# Patient Record
Sex: Male | Born: 1950 | Race: White | Hispanic: No | Marital: Married | Smoking: Never smoker
Health system: Southern US, Community
[De-identification: ages and names within clinical notes are randomized; demographics above are authoritative.]

## PROBLEM LIST (undated history)

## (undated) DIAGNOSIS — H9190 Unspecified hearing loss, unspecified ear: Secondary | ICD-10-CM

## (undated) DIAGNOSIS — F419 Anxiety disorder, unspecified: Secondary | ICD-10-CM

## (undated) DIAGNOSIS — E119 Type 2 diabetes mellitus without complications: Secondary | ICD-10-CM

## (undated) DIAGNOSIS — G4730 Sleep apnea, unspecified: Secondary | ICD-10-CM

## (undated) HISTORY — PX: TIBIA FRACTURE SURGERY: SHX806

## (undated) HISTORY — DX: Anxiety disorder, unspecified: F41.9

## (undated) HISTORY — DX: Sleep apnea, unspecified: G47.30

## (undated) HISTORY — DX: Unspecified hearing loss, unspecified ear: H91.90

## (undated) HISTORY — PX: HERNIA REPAIR: SHX51

## (undated) HISTORY — DX: Type 2 diabetes mellitus without complications: E11.9

---

## 1999-10-12 ENCOUNTER — Encounter: Admission: RE | Admit: 1999-10-12 | Discharge: 1999-10-12 | Payer: Self-pay | Admitting: *Deleted

## 1999-10-12 ENCOUNTER — Encounter: Payer: Self-pay | Admitting: *Deleted

## 2000-12-11 ENCOUNTER — Ambulatory Visit (HOSPITAL_BASED_OUTPATIENT_CLINIC_OR_DEPARTMENT_OTHER): Admission: RE | Admit: 2000-12-11 | Discharge: 2000-12-11 | Payer: Self-pay | Admitting: Family Medicine

## 2002-05-21 ENCOUNTER — Ambulatory Visit (HOSPITAL_BASED_OUTPATIENT_CLINIC_OR_DEPARTMENT_OTHER): Admission: RE | Admit: 2002-05-21 | Discharge: 2002-05-21 | Payer: Self-pay | Admitting: *Deleted

## 2004-02-01 ENCOUNTER — Ambulatory Visit: Payer: Self-pay | Admitting: Family Medicine

## 2005-03-05 ENCOUNTER — Ambulatory Visit: Payer: Self-pay | Admitting: Family Medicine

## 2006-03-13 ENCOUNTER — Ambulatory Visit: Payer: Self-pay | Admitting: Family Medicine

## 2006-04-29 ENCOUNTER — Ambulatory Visit: Payer: Self-pay | Admitting: Family Medicine

## 2007-02-04 ENCOUNTER — Ambulatory Visit (HOSPITAL_BASED_OUTPATIENT_CLINIC_OR_DEPARTMENT_OTHER): Admission: RE | Admit: 2007-02-04 | Discharge: 2007-02-04 | Payer: Self-pay | Admitting: Urology

## 2008-06-11 IMAGING — CR DG CHEST 2V
2 series · 2 of 2 positions shown · non-contrast
Comparison: None.

CLINICAL DATA: Preop. 
 CHEST - 2 VIEW ? 02/04/07:

[w chest pa]
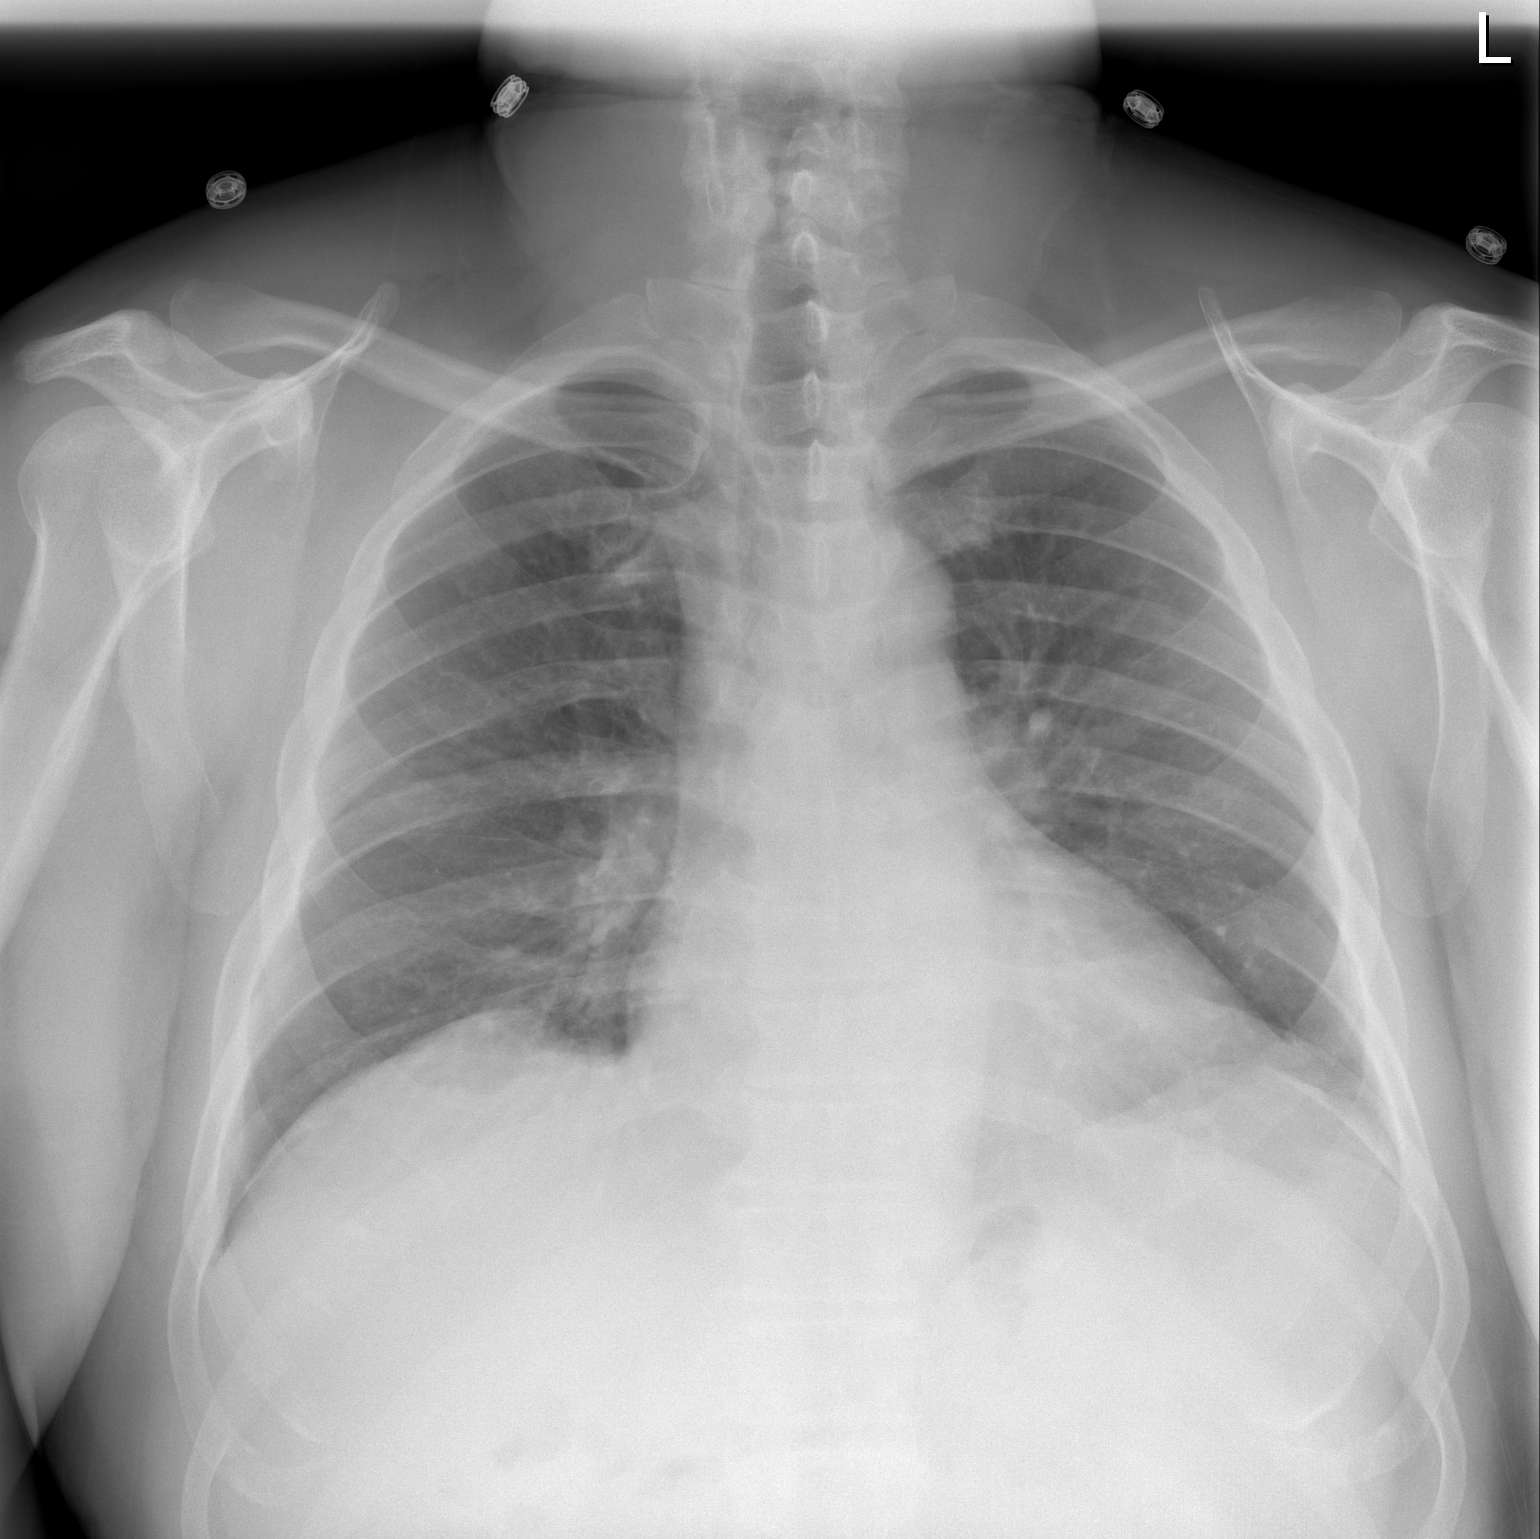

[w chest lat]
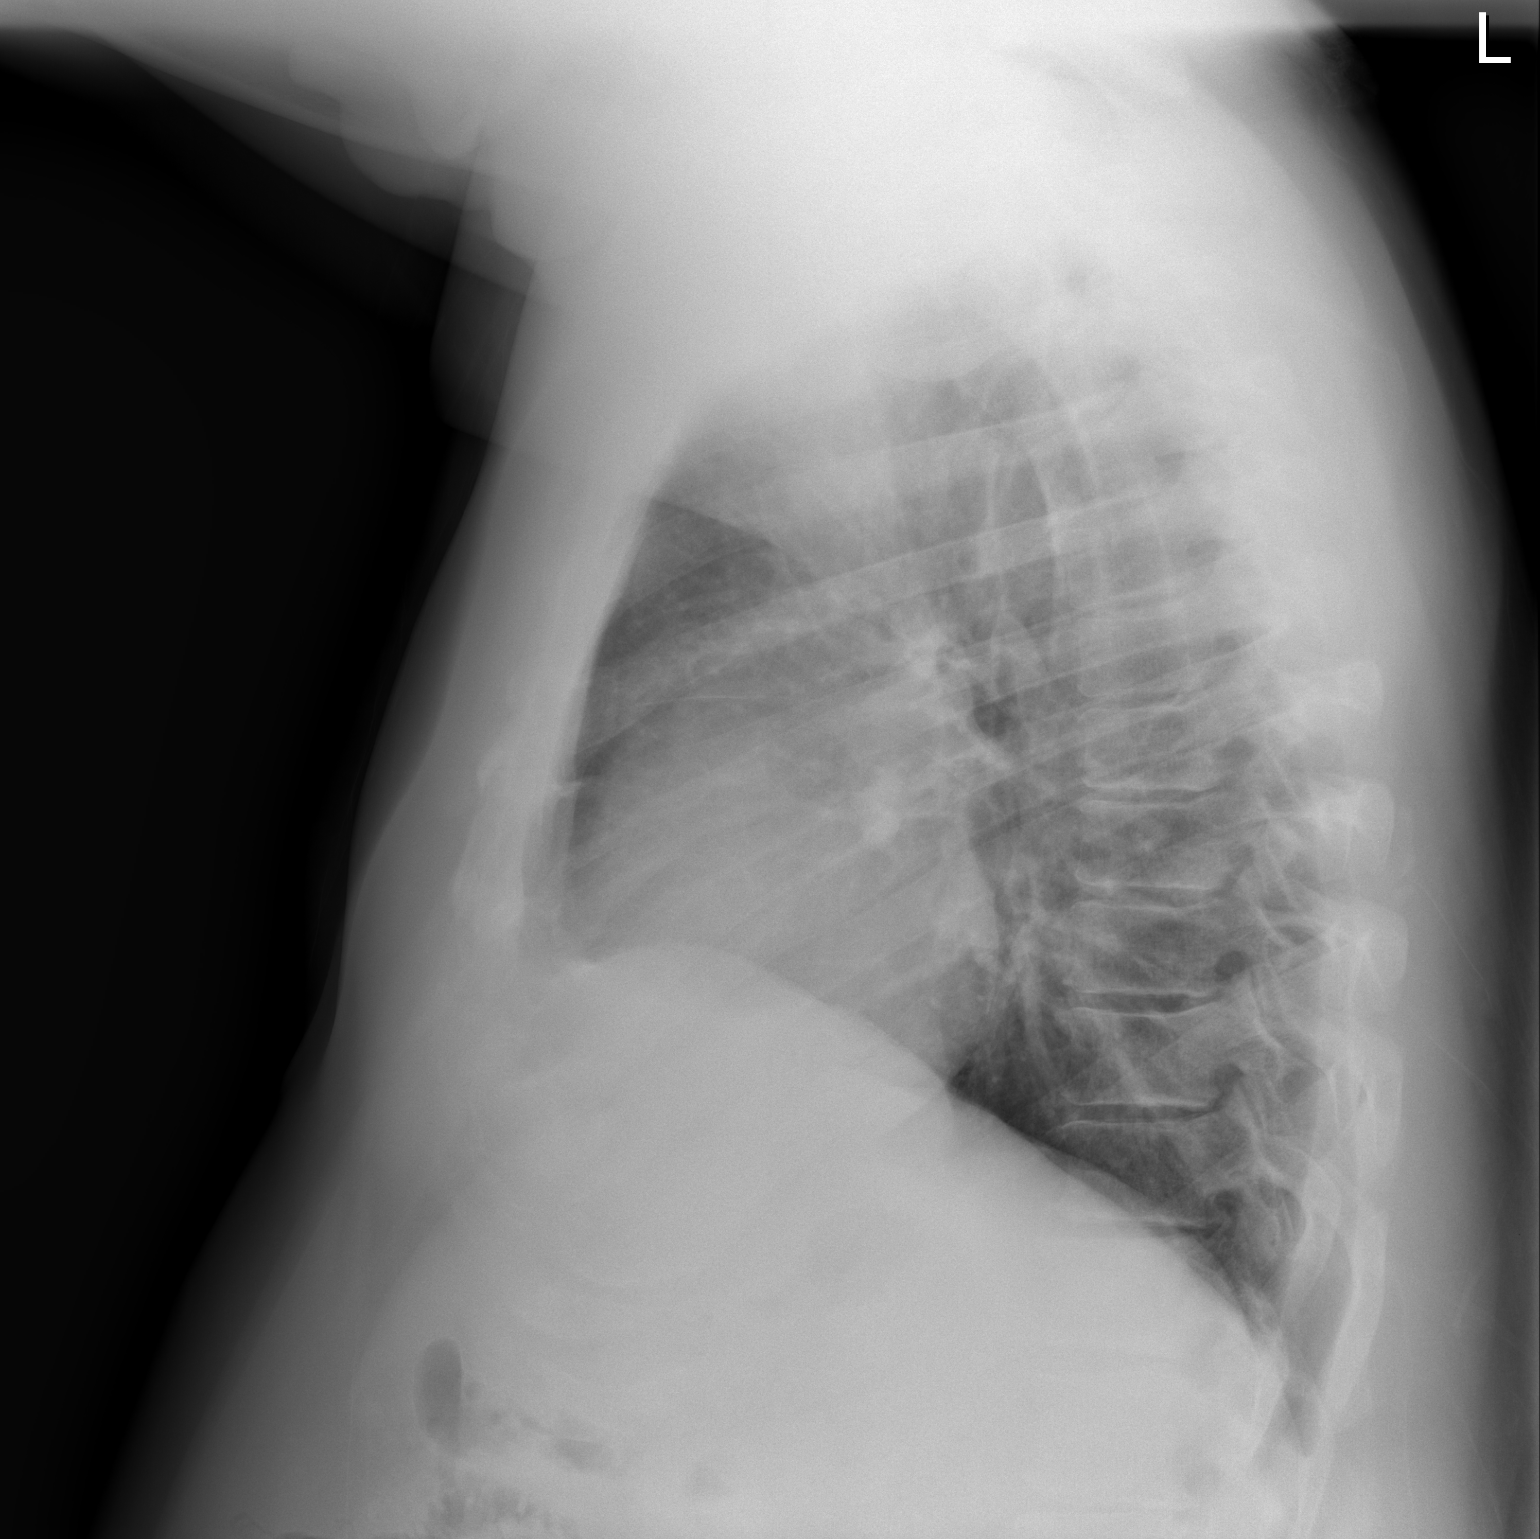

[2 of 2 positions shown; findings below may reference images not displayed]

FINDINGS: The trachea is midline.  Heart size is at the upper limits of normal.  The lungs are somewhat low in volume but clear. No pleural fluid.
IMPRESSION: No acute findings.

## 2009-05-11 ENCOUNTER — Ambulatory Visit (HOSPITAL_COMMUNITY): Admission: RE | Admit: 2009-05-11 | Discharge: 2009-05-12 | Payer: Self-pay | Admitting: Specialist

## 2009-07-21 ENCOUNTER — Ambulatory Visit (HOSPITAL_BASED_OUTPATIENT_CLINIC_OR_DEPARTMENT_OTHER): Admission: RE | Admit: 2009-07-21 | Discharge: 2009-07-21 | Payer: Self-pay | Admitting: Specialist

## 2010-05-21 LAB — POCT HEMOGLOBIN-HEMACUE: Hemoglobin: 14.5 g/dL (ref 13.0–17.0)

## 2010-05-27 LAB — URINALYSIS, ROUTINE W REFLEX MICROSCOPIC
Glucose, UA: NEGATIVE mg/dL
Hgb urine dipstick: NEGATIVE
Protein, ur: NEGATIVE mg/dL
Specific Gravity, Urine: 1.026 (ref 1.005–1.030)
Urobilinogen, UA: 1 mg/dL (ref 0.0–1.0)

## 2010-05-27 LAB — DIFFERENTIAL
Lymphocytes Relative: 24 % (ref 12–46)
Lymphs Abs: 2.3 10*3/uL (ref 0.7–4.0)
Monocytes Absolute: 0.8 10*3/uL (ref 0.1–1.0)
Monocytes Relative: 8 % (ref 3–12)
Neutro Abs: 6.5 10*3/uL (ref 1.7–7.7)
Neutrophils Relative %: 67 % (ref 43–77)

## 2010-05-27 LAB — BASIC METABOLIC PANEL
CO2: 30 mEq/L (ref 19–32)
Calcium: 9.9 mg/dL (ref 8.4–10.5)
Chloride: 101 mEq/L (ref 96–112)
Creatinine, Ser: 1.3 mg/dL (ref 0.4–1.5)
GFR calc Af Amer: >60 mL/min (ref >60)
Sodium: 142 mEq/L (ref 135–145)

## 2010-05-27 LAB — CBC
Hemoglobin: 15.7 g/dL (ref 13.0–17.0)
MCHC: 32.8 g/dL (ref 30.0–36.0)
RBC: 5.06 MIL/uL (ref 4.22–5.81)
WBC: 9.8 10*3/uL (ref 4.0–10.5)

## 2010-07-17 NOTE — Op Note (Signed)
Brian Abbott, Brian Abbott             ACCOUNT NO.:  [id number]   MEDICAL RECORD NO.:  [id number]          PATIENT TYPE:  AMB   LOCATION:  [facility]                         FACILITY:  [facility]   PHYSICIAN:  Courtney Paris, M.D.DATE OF BIRTH:  12-Jan-1951   DATE OF PROCEDURE:  02/04/2007  DATE OF DISCHARGE:                               OPERATIVE REPORT   PROCEDURES:  1. Circumcision.  2. Dorsal ring block.   PREOPERATIVE DIAGNOSIS:  Phimosis.   POSTOPERATIVE DIAGNOSIS:  Phimosis, status post circumcision.   PROCEDURE IN DETAIL:  The patient was brought back to the operating  room.  After the successful induction of anesthetic, performing a  preoperative time-out, and receiving preoperative antibiotics, he was  placed in a supine position and prepped and draped in the usual sterile  fashion.  With the foreskin retracted over the glans, the margin of the  glans was outlined over the skin with a marker.  This was incised  circumferentially.  Then hemostats were placed on the lateral aspect of  the foreskin and using a pair of Metzenbaum scissors, the skin was  incised from the prepuce along to the incision circumferentially.  This  was done in much the same fashion as a dorsal slit is.  At this point a  circumferential mucosal cuff was created at approximately 5 mm proximal  to the glans.  Then using Bovie cautery, this excessive foreskin between  both circumferential incisions was removed.  Bleeding was stopped with  fulguration.  The foreskin was then reapproximated in the mucosal plane,  cuffed circumferentially with interrupted 4-0 chromic suture.  The penis  was then cleaned, collodion was placed.  Once dry, Vaseline gauze  dressing was placed in a nonocclusive fashion.  A Tegaderm dressing was  placed over this.  Prior to placing the dressing a dorsal ring block was  done with lidocaine without epinephrine.  Please note that Courtney Paris, M.D., was the attending and was  present throughout the  entirety of the case.   ESTIMATED BLOOD LOSS:  Minimal.   URINE OUTPUT:  Unrecorded.   DRAINS:  None.   SPECIMENS:  Foreskin for discard.   DISPOSITION:  The patient will go to the PACU for further care.     ______________________________  Duane Boston, MD      Courtney Paris, M.D.  Electronically Signed    BP/MEDQ  D:  02/04/2007  T:  02/04/2007  Job:  [id number]

## 2010-09-14 IMAGING — CR DG CHEST 2V
2 series · 2 of 2 positions shown · non-contrast
Comparison: 02/04/2007

CLINICAL DATA: Preop evaluation for ankle surgery, ankle fracture

CHEST - 2 VIEW

[w chest pa *]
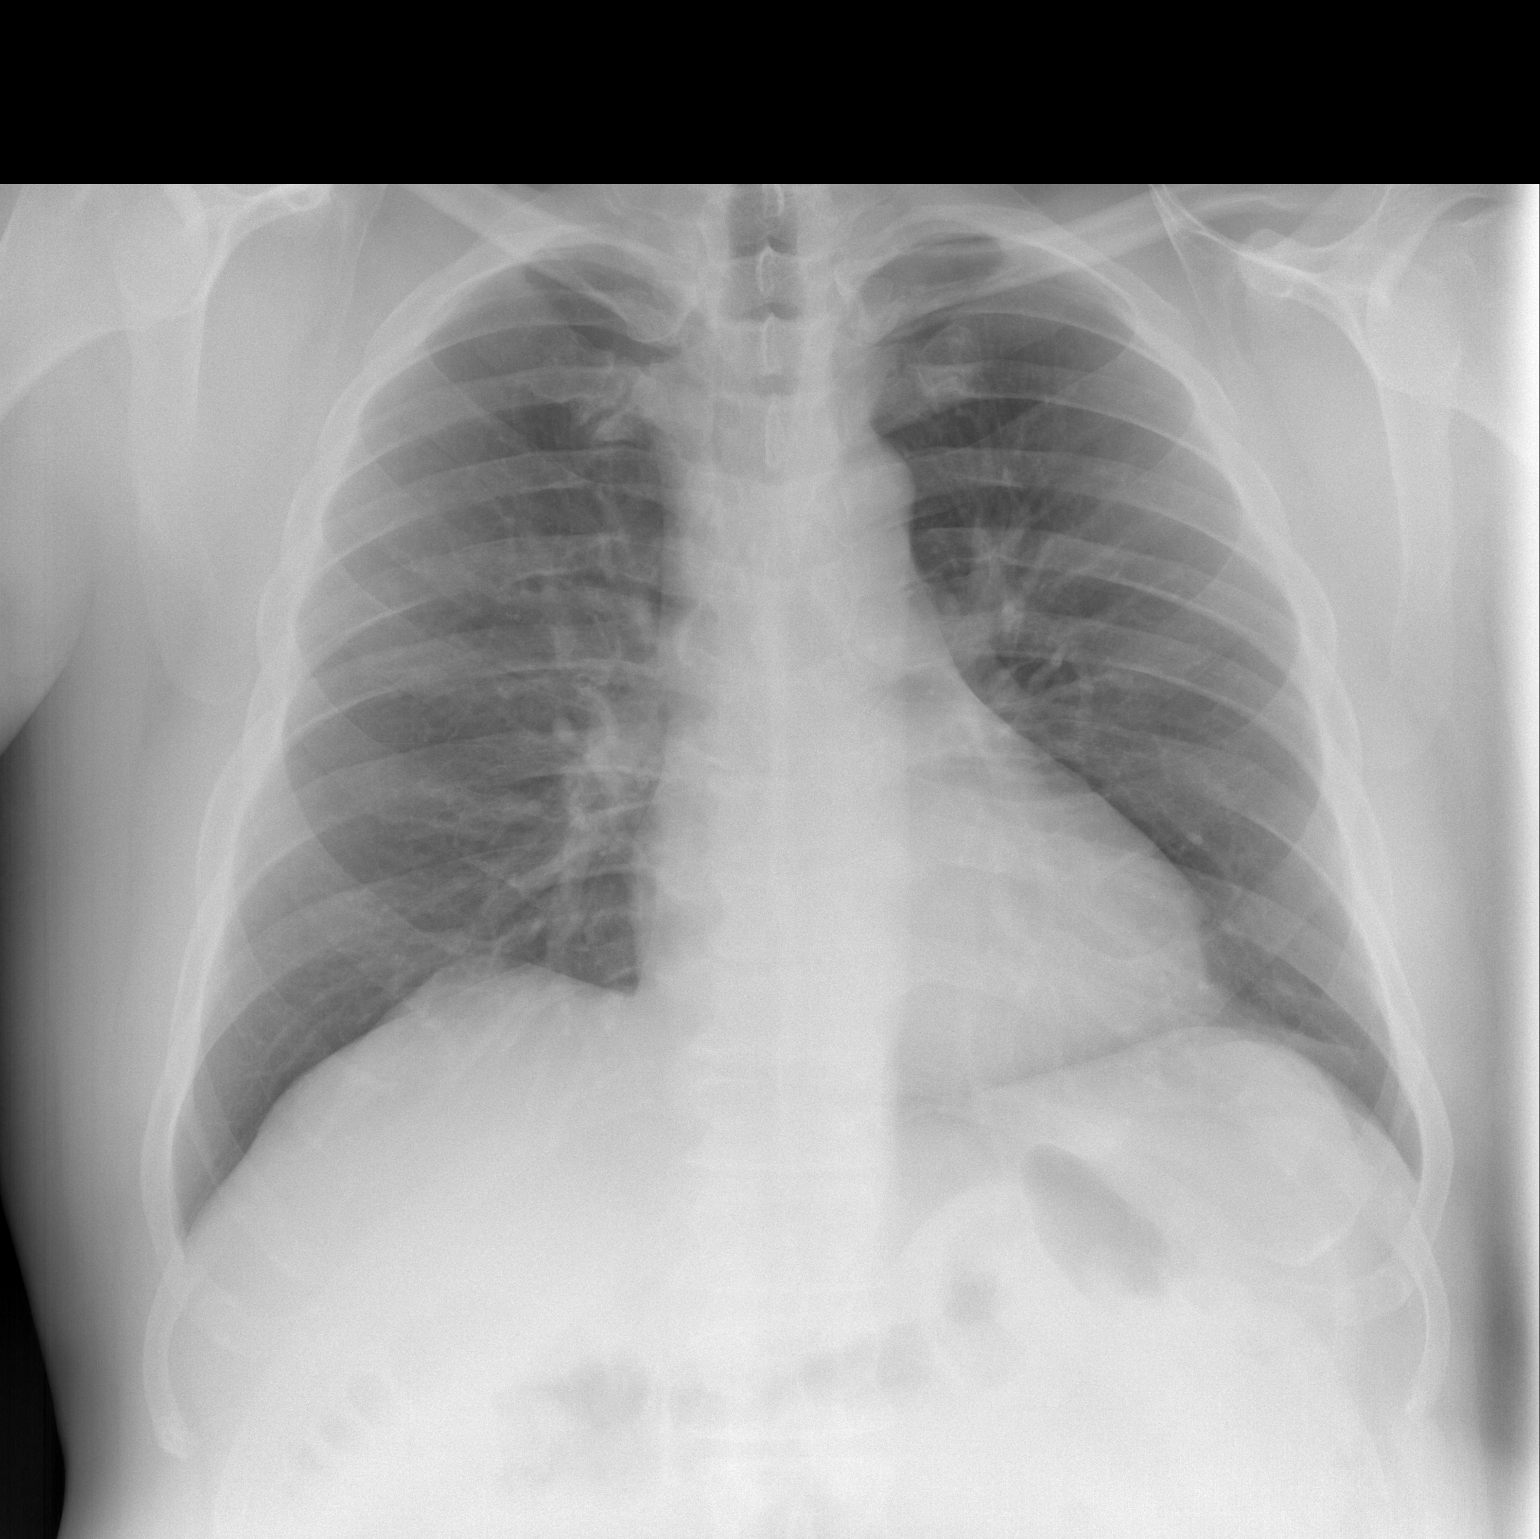

[w chest lat *]
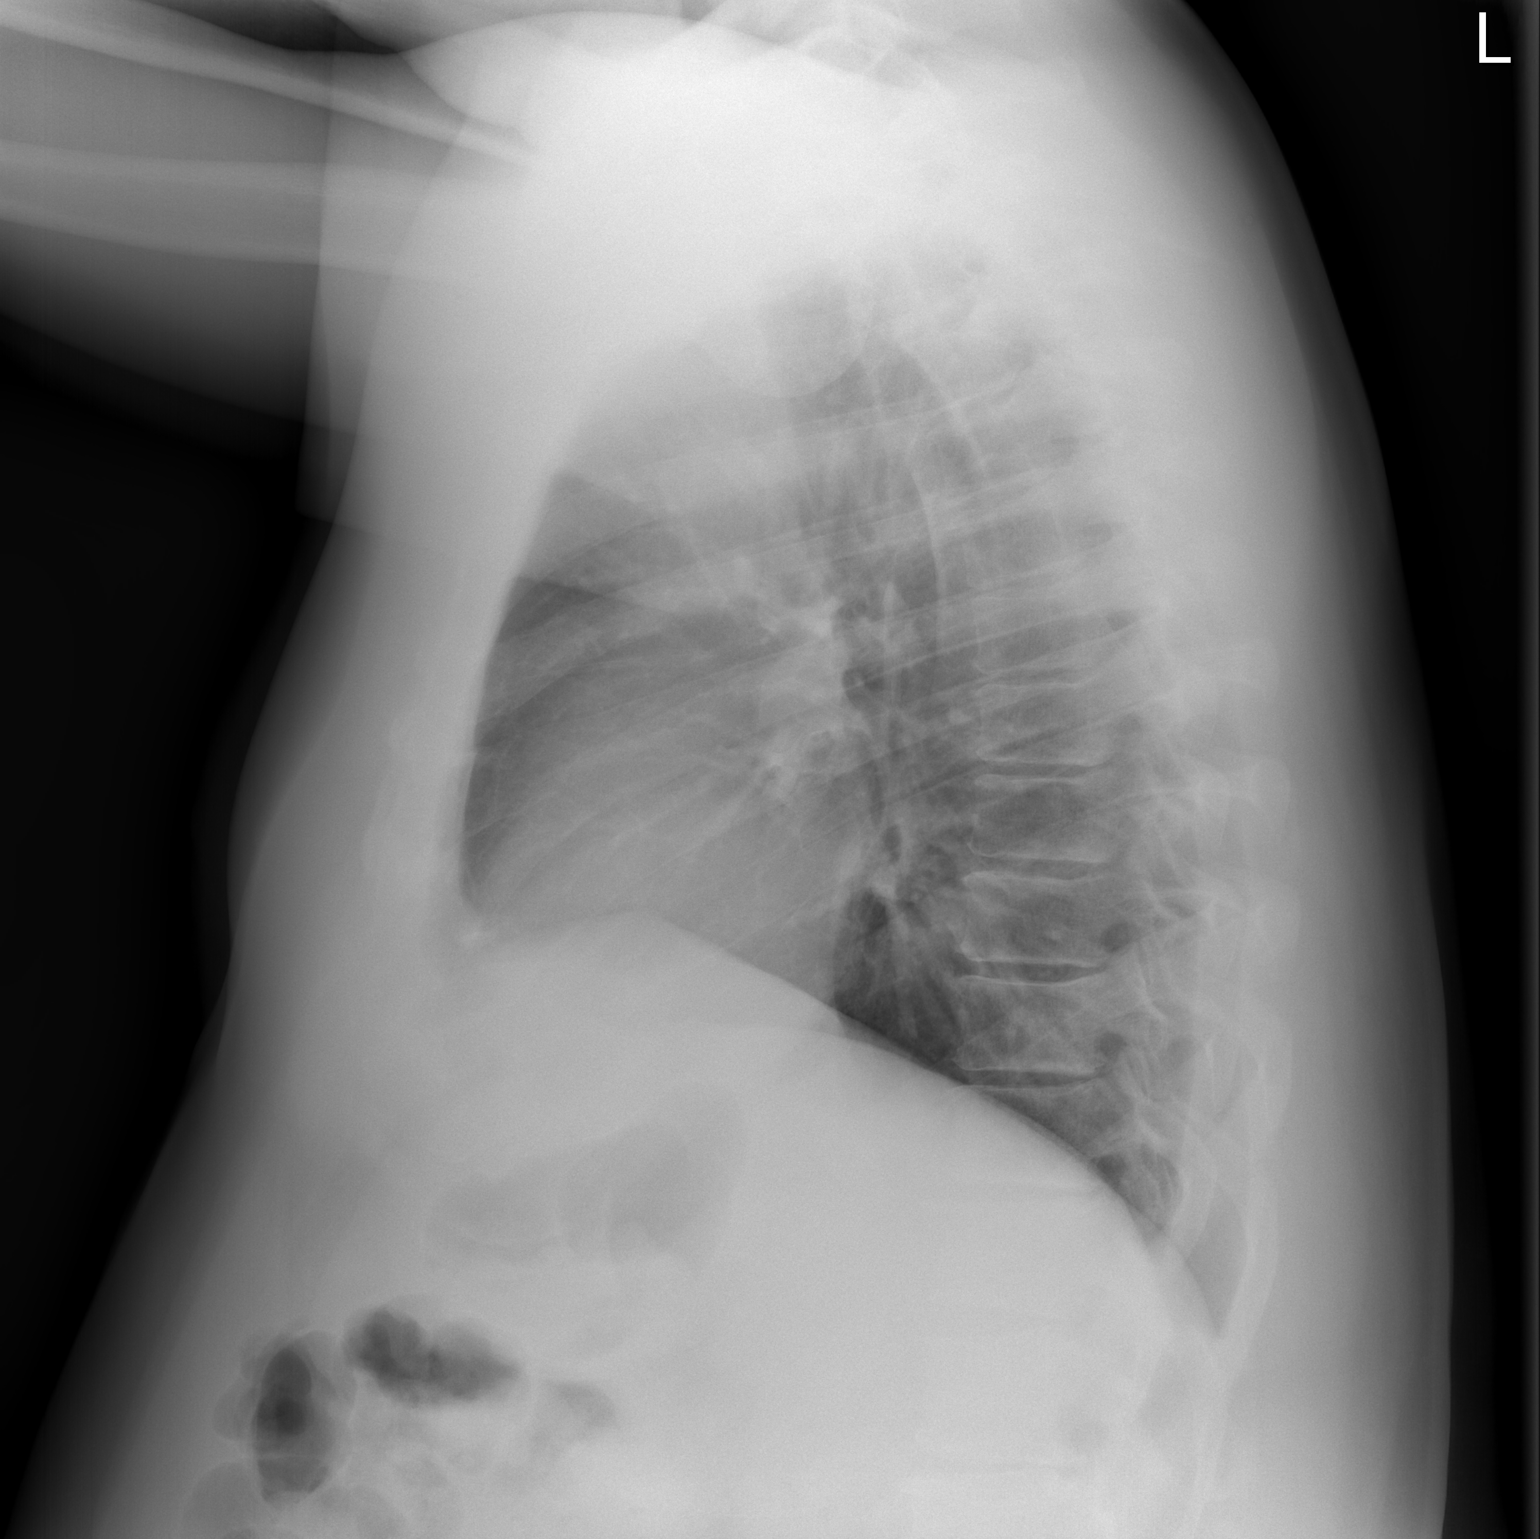

[2 of 2 positions shown; findings below may reference images not displayed]

FINDINGS: The heart size and mediastinal contours are within
normal limits.  Both lungs are clear.  The visualized skeletal
structures are unremarkable.
IMPRESSION: No active cardiopulmonary disease.

## 2010-09-16 IMAGING — RF DG ANKLE 2V *R*
1 series · 2 of 2 positions shown · non-contrast
Comparison: None

CLINICAL DATA: Right ankle fracture.

RIGHT ANKLE - 2 VIEW

[Series 1: run · 2 of 2 slices shown]
[im 1/2]
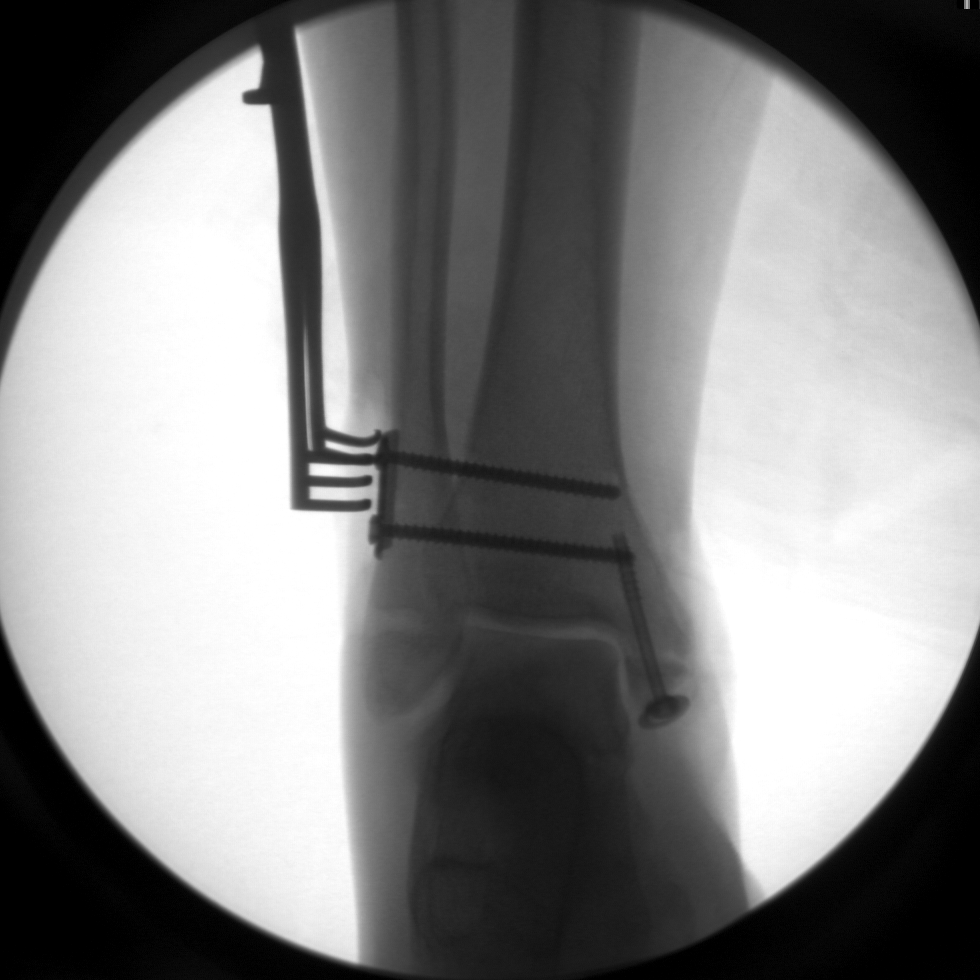
[im 2/2]
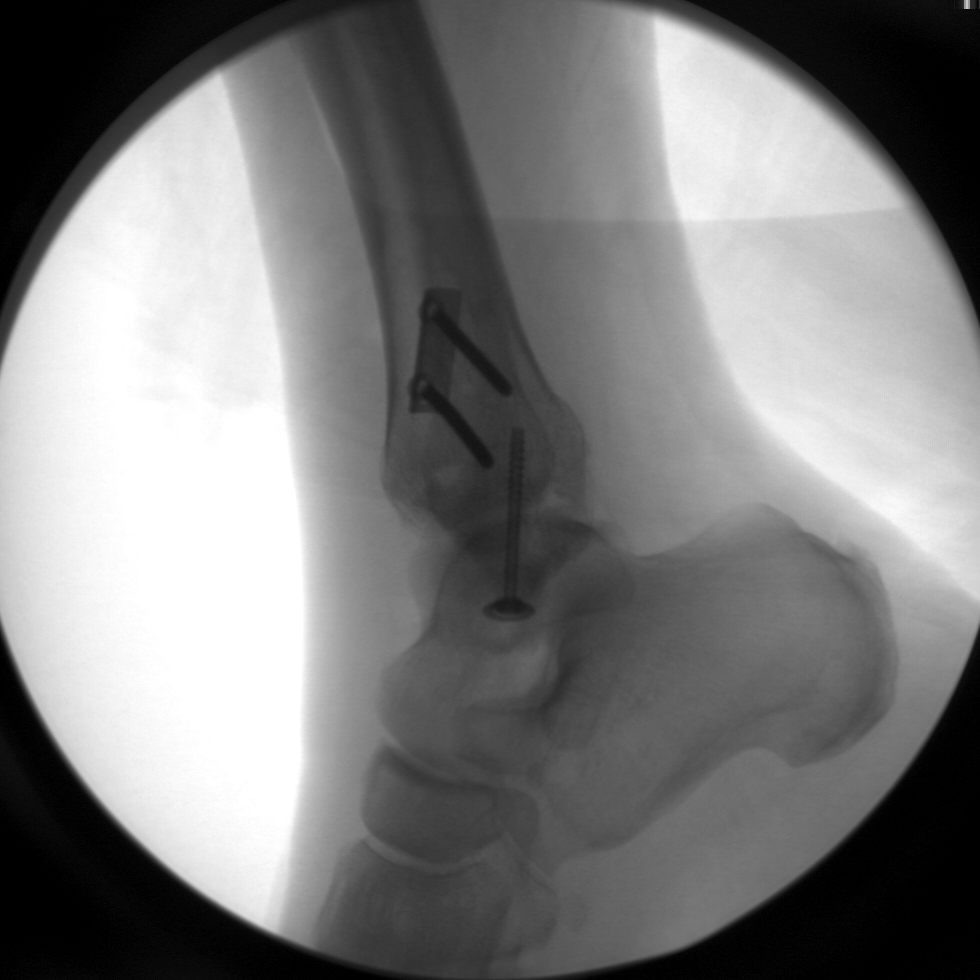

[2 of 2 positions shown; findings below may reference images not displayed]

FINDINGS: Two intraoperative spot images demonstrate internal
fixation within the right ankle.  Distal fibular plate and screw
device in place with screws extending into the tibia.  Medial
malleolar screw present.  Near anatomic alignment.
IMPRESSION: Internal fixation with near anatomic alignment.

## 2010-12-10 LAB — POCT I-STAT 4, (NA,K, GLUC, HGB,HCT)
Glucose, Bld: 116 — ABNORMAL HIGH
HCT: 47
Hemoglobin: 16
Potassium: 4.2
Sodium: 138

## 2010-12-10 LAB — URINALYSIS, MICROSCOPIC ONLY
Bilirubin Urine: NEGATIVE
Ketones, ur: NEGATIVE
Leukocytes, UA: NEGATIVE
Nitrite: NEGATIVE
Protein, ur: NEGATIVE
Urobilinogen, UA: 1
pH: 7

## 2012-06-18 ENCOUNTER — Telehealth: Payer: Self-pay | Admitting: Physician Assistant

## 2012-06-18 MED ORDER — METFORMIN HCL 500 MG PO TABS: 500.0000 mg | ORAL_TABLET | Freq: Every day | ORAL | Status: DC

## 2012-06-18 NOTE — Telephone Encounter (Signed)
Filled today, called pt.

## 2012-09-28 DIAGNOSIS — F329 Major depressive disorder, single episode, unspecified: Secondary | ICD-10-CM | POA: Insufficient documentation

## 2012-09-28 DIAGNOSIS — E1169 Type 2 diabetes mellitus with other specified complication: Secondary | ICD-10-CM | POA: Insufficient documentation

## 2012-09-28 DIAGNOSIS — E119 Type 2 diabetes mellitus without complications: Secondary | ICD-10-CM | POA: Insufficient documentation

## 2016-12-03 DIAGNOSIS — G4730 Sleep apnea, unspecified: Secondary | ICD-10-CM | POA: Insufficient documentation

## 2016-12-06 LAB — HM COLONOSCOPY

## 2016-12-11 DIAGNOSIS — N400 Enlarged prostate without lower urinary tract symptoms: Secondary | ICD-10-CM | POA: Insufficient documentation

## 2017-06-03 DIAGNOSIS — I10 Essential (primary) hypertension: Secondary | ICD-10-CM | POA: Insufficient documentation

## 2017-06-03 DIAGNOSIS — N183 Chronic kidney disease, stage 3 unspecified: Secondary | ICD-10-CM | POA: Insufficient documentation

## 2019-05-10 ENCOUNTER — Ambulatory Visit: Payer: Self-pay | Attending: Internal Medicine

## 2019-05-10 DIAGNOSIS — Z23 Encounter for immunization: Secondary | ICD-10-CM | POA: Insufficient documentation

## 2019-05-10 NOTE — Progress Notes (Signed)
   Covid-19 Vaccination Clinic  Name:  Brian Abbott    MRN: [id number] DOB: 1950/05/29  05/10/2019  Mr. Brian Abbott was observed post Covid-19 immunization for 15 minutes without incident. He was provided with Vaccine Information Sheet and instruction to access the V-Safe system.   Mr. Brian Abbott was instructed to call 911 with any severe reactions post vaccine: Marland Kitchen Difficulty breathing  . Swelling of face and throat  . A fast heartbeat  . A bad rash all over body  . Dizziness and weakness   Immunizations Administered    Name Date Dose VIS Date Route   Pfizer COVID-19 Vaccine 05/10/2019  9:30 AM 0.3 mL 02/12/2019 Intramuscular   Manufacturer: [employer], [employer]   Lot: [id number]   NDC: [address]-8

## 2019-06-15 ENCOUNTER — Ambulatory Visit: Payer: Self-pay | Attending: Internal Medicine

## 2019-06-15 DIAGNOSIS — Z23 Encounter for immunization: Secondary | ICD-10-CM

## 2019-06-15 NOTE — Progress Notes (Signed)
   Covid-19 Vaccination Clinic  Name:  BLAYDE BACIGALUPI    MRN: [id number] DOB: 09-Mar-1950  06/15/2019  Mr. Boccio was observed post Covid-19 immunization for 15 minutes without incident. He was provided with Vaccine Information Sheet and instruction to access the V-Safe system.   Mr. Volner was instructed to call 911 with any severe reactions post vaccine: Marland Kitchen Difficulty breathing  . Swelling of face and throat  . A fast heartbeat  . A bad rash all over body  . Dizziness and weakness   Immunizations Administered    Name Date Dose VIS Date Route   Pfizer COVID-19 Vaccine 06/15/2019  8:38 AM 0.3 mL 02/12/2019 Intramuscular   Manufacturer: [employer], [employer]   Lot: [id number]   NDC: [address]-2

## 2020-05-21 LAB — PULMONARY FUNCTION TEST

## 2021-06-04 ENCOUNTER — Encounter: Payer: Self-pay | Admitting: Family Medicine

## 2021-06-04 DIAGNOSIS — E119 Type 2 diabetes mellitus without complications: Secondary | ICD-10-CM | POA: Insufficient documentation

## 2021-06-26 ENCOUNTER — Ambulatory Visit (INDEPENDENT_AMBULATORY_CARE_PROVIDER_SITE_OTHER): Payer: Medicare Other | Admitting: Family Medicine

## 2021-06-26 ENCOUNTER — Encounter: Payer: Self-pay | Admitting: Family Medicine

## 2021-06-26 VITALS — BP 102/61 | HR 65 | Temp 97.6°F | Ht 69.0 in | Wt 218.4 lb

## 2021-06-26 DIAGNOSIS — G250 Essential tremor: Secondary | ICD-10-CM

## 2021-06-26 DIAGNOSIS — F411 Generalized anxiety disorder: Secondary | ICD-10-CM

## 2021-06-26 DIAGNOSIS — E119 Type 2 diabetes mellitus without complications: Secondary | ICD-10-CM

## 2021-06-26 LAB — BAYER DCA HB A1C WAIVED: HB A1C (BAYER DCA - WAIVED): 7.2 % — ABNORMAL HIGH (ref 4.8–5.6)

## 2021-06-26 MED ORDER — LOSARTAN POTASSIUM 50 MG PO TABS: 50.0000 mg | ORAL_TABLET | Freq: Every day | ORAL | 3 refills | Status: DC

## 2021-06-26 NOTE — Progress Notes (Signed)
? ?Subjective:  ?Patient ID: Brian Abbott, male    DOB: 1950/11/06  Age: 71 y.o. MRN: [id number] ? ?CC: New Patient (Initial Visit) ? ? ?HPI ?Brian Abbott presents for new patient visit.  ?presents forFollow-up of diabetes. States it is under control. Patient checks blood sugar at home.  ?Patient denies symptoms such as polyuria, polydipsia, excessive hunger, nausea ?No significant hypoglycemic spells noted. ?Medications reviewed. Pt reports taking them regularly without complication/adverse reaction being reported today.  ? ?Sleep apnea. CPAP monitored through the [place].  ? ?Treated for anxiety.Taking Paxil and clonazepam.  ?   ? View : No data to display.  ?  ?  ?  ? ?Has problem being unsteady. Using primidone and it helps steady him, especially for shooting. 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  ? ? ?  06/26/2021  ? 10:46 AM  ?Depression screen PHQ 2/9  ?Decreased Interest 0  ?Down, Depressed, Hopeless 0  ?PHQ - 2 Score 0  ? ? ?History ?Brian Abbott has a past medical history of Anxiety, Diabetes mellitus without complication ([place]), Hearing loss, and Sleep apnea.  ? ?He has a past surgical history that includes Tibia fracture surgery (Right) and Hernia repair.  ? ?His family history is not on file.He reports that he has never smoked. He has never used smokeless tobacco. He reports that he does not currently use alcohol. He reports that he does not currently use drugs. ? ? ? ?ROS ?Review of Systems  ?Constitutional: Negative.  Negative for fever.  ?HENT: Negative.    ?Eyes:  Negative for visual disturbance.  ?Respiratory:  Negative for cough and shortness of breath.   ?Cardiovascular:  Negative for chest pain and leg swelling.  ?Gastrointestinal:  Negative for abdominal pain, diarrhea, nausea and vomiting.  ?Genitourinary:  Negative for difficulty urinating.  ?Musculoskeletal:  Negative for arthralgias and myalgias.  ?Skin:  Negative for rash.  ?Neurological:  Negative for headaches.  ?Psychiatric/Behavioral:   Negative for sleep disturbance.   ? ?Objective:  ?BP 102/61   Pulse 65   Temp 97.6 ?F (36.4 ?C)   Ht $R'5\' 9"'vh$  (1.753 m)   Wt 218 lb 6.4 oz (99.1 kg)   SpO2 96%   BMI 32.25 kg/m?  ? ?BP Readings from Last 3 Encounters:  ?06/26/21 102/61  ? ? ?Wt Readings from Last 3 Encounters:  ?06/26/21 218 lb 6.4 oz (99.1 kg)  ? ? ? ?Physical Exam ?Constitutional:   ?   General: He is not in acute distress. ?   Appearance: He is well-developed.  ?HENT:  ?   Head: Normocephalic and atraumatic.  ?   Right Ear: External ear normal.  ?   Left Ear: External ear normal.  ?   Nose: Nose normal.  ?Eyes:  ?   Conjunctiva/sclera: Conjunctivae normal.  ?   Pupils: Pupils are equal, round, and reactive to light.  ?Cardiovascular:  ?   Rate and Rhythm: Normal rate and regular rhythm.  ?   Heart sounds: Normal heart sounds. No murmur heard. ?Pulmonary:  ?   Effort: Pulmonary effort is normal. No respiratory distress.  ?   Breath sounds: Normal breath sounds. No wheezing or rales.  ?Abdominal:  ?   Palpations: Abdomen is soft.  ?   Tenderness: There is no abdominal tenderness.  ?Musculoskeletal:     ?   General: Normal range of motion.  ?   Cervical back: Normal range of motion and neck supple.  ?Skin: ?   General: Skin is  warm and dry.  ?Neurological:  ?   Mental Status: He is alert and oriented to person, place, and time.  ?   Deep Tendon Reflexes: Reflexes are normal and symmetric.  ?Psychiatric:     ?   Behavior: Behavior normal.     ?   Thought Content: Thought content normal.     ?   Judgment: Judgment normal.  ? ? ? ? ?Assessment & Plan:  ? ?Brian Abbott was seen today for new patient (initial visit). ? ?Diagnoses and all orders for this visit: ? ?Diabetes mellitus without complication ([place]) ?-     Bayer DCA Hb A1c Waived ?-     CBC with Differential/Platelet ?-     CMP14+EGFR ? ?Essential tremor ? ?GAD (generalized anxiety disorder) ? ?Other orders ?-     losartan (COZAAR) 50 MG tablet; Take 1 tablet (50 mg total) by mouth  daily. ? ? ? ? ? ? ?I am having Brian Abbott. Payer start on losartan. I am also having him maintain his aspirin EC, benzonatate, cetirizine, clonazePAM, fluticasone, GLIPIZIDE PO, Glucosamine HCl (GLUCOSAMINE PO), IBUPROFEN PO, ipratropium, Omega-3 Fatty Acids (FISH OIL PO), metFORMIN, PARoxetine, pravastatin, PRIMIDONE PO, tamsulosin, and multivitamins ther. w/minerals. ? ?Allergies as of 06/26/2021   ?No Known Allergies ?  ? ?  ?Medication List  ?  ? ?  ? Accurate as of June 26, 2021 12:13 PM. If you have any questions, ask your nurse or doctor.  ?  ?  ? ?  ? ?aspirin EC 81 MG tablet ?Take 81 mg by mouth daily. Swallow whole. ?  ?benzonatate 100 MG capsule ?Commonly known as: TESSALON ?Take 100 mg by mouth 3 (three) times daily as needed for cough. ?  ?cetirizine 10 MG tablet ?Commonly known as: ZYRTEC ?Take 10 mg by mouth as needed for allergies. ?  ?clonazePAM 1 MG tablet ?Commonly known as: KLONOPIN ?Take 1 mg by mouth at bedtime. ?  ?FISH OIL PO ?Take by mouth at bedtime. ?  ?fluticasone 50 MCG/ACT nasal spray ?Commonly known as: FLONASE ?Place 2 sprays into both nostrils in the morning and at bedtime. ?  ?GLIPIZIDE PO ?Take by mouth in the morning and at bedtime. ?  ?GLUCOSAMINE PO ?Take by mouth in the morning and at bedtime. ?  ?IBUPROFEN PO ?Take by mouth in the morning and at bedtime. ?  ?ipratropium 0.06 % nasal spray ?Commonly known as: ATROVENT ?Place 2 sprays into both nostrils 3 (three) times daily. ?  ?losartan 50 MG tablet ?Commonly known as: COZAAR ?Take 1 tablet (50 mg total) by mouth daily. ?Started by: Claretta Fraise, MD ?  ?metFORMIN 500 MG tablet ?Commonly known as: GLUCOPHAGE ?Take by mouth 2 (two) times daily with a meal. ?What changed: Another medication with the same name was removed. Continue taking this medication, and follow the directions you see here. ?Changed by: Claretta Fraise, MD ?  ?multivitamins ther. w/minerals Tabs tablet ?Take 1 tablet by mouth daily. ?  ?PARoxetine 40 MG  tablet ?Commonly known as: PAXIL ?Take 40 mg by mouth daily. ?  ?pravastatin 40 MG tablet ?Commonly known as: PRAVACHOL ?Take 40 mg by mouth at bedtime. ?  ?PRIMIDONE PO ?Take by mouth at bedtime. ?  ?tamsulosin 0.4 MG Caps capsule ?Commonly known as: FLOMAX ?Take 0.4 mg by mouth in the morning and at bedtime. ?  ? ?  ? ? ? ?Follow-up: Return in about 3 months (around 09/25/2021). ? ?Claretta Fraise, M.D. ?

## 2021-06-27 LAB — CBC WITH DIFFERENTIAL/PLATELET
Basophils Absolute: 0.1 10*3/uL (ref 0.0–0.2)
Basos: 1 %
EOS (ABSOLUTE): 0.1 10*3/uL (ref 0.0–0.4)
Eos: 2 %
Hematocrit: 38.7 % (ref 37.5–51.0)
Hemoglobin: 12.9 g/dL — ABNORMAL LOW (ref 13.0–17.7)
Immature Grans (Abs): 0 10*3/uL (ref 0.0–0.1)
Immature Granulocytes: 0 %
Lymphocytes Absolute: 2 10*3/uL (ref 0.7–3.1)
Lymphs: 28 %
MCH: 30.5 pg (ref 26.6–33.0)
MCHC: 33.3 g/dL (ref 31.5–35.7)
MCV: 92 fL (ref 79–97)
Monocytes Absolute: 0.5 10*3/uL (ref 0.1–0.9)
Monocytes: 7 %
Neutrophils Absolute: 4.4 10*3/uL (ref 1.4–7.0)
Neutrophils: 62 %
Platelets: 215 10*3/uL (ref 150–450)
RBC: 4.23 x10E6/uL (ref 4.14–5.80)
RDW: 12.4 % (ref 11.6–15.4)
WBC: 7 10*3/uL (ref 3.4–10.8)

## 2021-06-27 LAB — CMP14+EGFR
ALT: 22 IU/L (ref 0–44)
AST: 25 IU/L (ref 0–40)
Albumin/Globulin Ratio: 1.9 (ref 1.2–2.2)
Albumin: 4.5 g/dL (ref 3.8–4.8)
Alkaline Phosphatase: 81 IU/L (ref 44–121)
BUN/Creatinine Ratio: 10 (ref 10–24)
BUN: 13 mg/dL (ref 8–27)
Bilirubin Total: 0.4 mg/dL (ref 0.0–1.2)
CO2: 21 mmol/L (ref 20–29)
Calcium: 9.6 mg/dL (ref 8.6–10.2)
Chloride: 103 mmol/L (ref 96–106)
Creatinine, Ser: 1.36 mg/dL — ABNORMAL HIGH (ref 0.76–1.27)
Globulin, Total: 2.4 g/dL (ref 1.5–4.5)
Glucose: 173 mg/dL — ABNORMAL HIGH (ref 70–99)
Potassium: 5 mmol/L (ref 3.5–5.2)
Sodium: 138 mmol/L (ref 134–144)
Total Protein: 6.9 g/dL (ref 6.0–8.5)
eGFR: 56 mL/min/{1.73_m2} — ABNORMAL LOW (ref >59)

## 2021-07-04 ENCOUNTER — Telehealth: Payer: Self-pay | Admitting: Family Medicine

## 2021-07-04 ENCOUNTER — Other Ambulatory Visit: Payer: Self-pay | Admitting: Family Medicine

## 2021-07-04 MED ORDER — PRAVASTATIN SODIUM 40 MG PO TABS: 40.0000 mg | ORAL_TABLET | Freq: Every evening | ORAL | 1 refills | Status: DC

## 2021-07-04 NOTE — Telephone Encounter (Signed)
Has never been filled by our office- please advise  ?

## 2021-07-04 NOTE — Telephone Encounter (Signed)
Please let the patient know that I sent their prescription to their pharmacy. Thanks, WS 

## 2021-07-04 NOTE — Telephone Encounter (Signed)
PATIENT AWARE

## 2021-07-04 NOTE — Telephone Encounter (Signed)
?  Prescription Request ? ?07/04/2021 ? ?Is this a "Controlled Substance" medicine? Pravastation 40 mg one a day. ? ?Have you seen your PCP in the last 2 weeks? Pt has new pt apt on 06/26/2021 and has a return apt 09/27/2021 ? ?If YES, route message to pool  -  If NO, patient needs to be scheduled for appointment. ? ?What is the name of the medication or equipment? Pravastation 40 mg one a day. ? ?Have you contacted your pharmacy to request a refill? No pt needs new rx. He transferred care to [facility]  ? ?Which pharmacy would you like this sent to? [facility] ? ? ?Patient notified that their request is being sent to the clinical staff for review and that they should receive a response within 2 business days.  ? ? ?

## 2021-07-12 ENCOUNTER — Telehealth: Payer: Self-pay | Admitting: Family Medicine

## 2021-07-12 NOTE — Telephone Encounter (Signed)
Has not been sent in my Dr. Darlyn Read. ?Will send it to him to advise.  ?

## 2021-07-12 NOTE — Telephone Encounter (Signed)
I have seen him but we didn't get the strength, sig of this medicaion. Please get these from the patient or his pharmacy and I will renew the med.  ?

## 2021-07-12 NOTE — Telephone Encounter (Signed)
?  Prescription Request ? ?07/12/2021 ? ?Is this a "Controlled Substance" medicine? no ? ?Have you seen your PCP in the last 2 weeks? Pt seen 06/26/21 has f/u with PCP on 09/27/2021 ? ?If YES, route message to pool  -  If NO, patient needs to be scheduled for appointment. ? ?What is the name of the medication or equipment? PRIMIDONE PO  ? ?Have you contacted your pharmacy to request a refill? Yes, pt has a week left  ? ?Which pharmacy would you like this sent to? [facility] ? ? ?Patient notified that their request is being sent to the clinical staff for review and that they should receive a response within 2 business days.  ? ? ?

## 2021-07-13 MED ORDER — PRIMIDONE 50 MG PO TABS: 100.0000 mg | ORAL_TABLET | Freq: Every day | ORAL | 1 refills | Status: DC

## 2021-07-13 NOTE — Telephone Encounter (Signed)
Sent for = pt aware - dose updated  ?

## 2021-07-13 NOTE — Telephone Encounter (Signed)
Called pt = he will send the dose and we will order from there - he has only got this from [place] in the past - no local pharm,  ? ?We will send to the drug store in [place] when dose comes through  ?

## 2021-07-31 ENCOUNTER — Other Ambulatory Visit: Payer: Self-pay | Admitting: Family Medicine

## 2021-07-31 ENCOUNTER — Telehealth: Payer: Self-pay | Admitting: Family Medicine

## 2021-07-31 MED ORDER — METFORMIN HCL 500 MG PO TABS: 500.0000 mg | ORAL_TABLET | Freq: Two times a day (BID) | ORAL | 3 refills | Status: DC

## 2021-07-31 MED ORDER — PAROXETINE HCL 40 MG PO TABS: 40.0000 mg | ORAL_TABLET | Freq: Every day | ORAL | 3 refills | Status: DC

## 2021-07-31 NOTE — Telephone Encounter (Signed)
Please let the patient know that I sent their prescription to their pharmacy. Thanks, WS 

## 2021-07-31 NOTE — Telephone Encounter (Signed)
  Prescription Request  07/31/2021  Is this a "Controlled Substance" medicine? NO  Have you seen your PCP in the last 2 weeks? NO  If YES, route message to pool  -  If NO, patient needs to be scheduled for appointment.  What is the name of the medication or equipment? Metformin 500 mg, Paroxetine 40 mg - Patient is seeing Dr. Darlyn Read instead of the [place]. Has sent a list of medications to nurse to be called in and hadn't been called in.  Have you contacted your pharmacy to request a refill? NO   [facility]   Patient notified that their request is being sent to the clinical staff for review and that they should receive a response within 2 business days.

## 2021-08-10 ENCOUNTER — Other Ambulatory Visit: Payer: Self-pay | Admitting: Family Medicine

## 2021-08-10 NOTE — Telephone Encounter (Signed)
  Prescription Request  08/10/2021  Is this a "Controlled Substance" medicine? no  Have you seen your PCP in the last 2 weeks? no  If YES, route message to pool  -  If NO, patient needs to be scheduled for appointment.  What is the name of the medication or equipment?  ipratropium (ATROVENT) 0.06 % nasal spray fluticasone (FLONASE) 50 MCG/ACT nasal spray Prop 50 MCG--allergy rx  Have you contacted your pharmacy to request a refill? No-need to switch from [place] to local drug store  Which pharmacy would you like this sent to? [facility]   Patient notified that their request is being sent to the clinical staff for review and that they should receive a response within 2 business days.

## 2021-08-12 MED ORDER — IPRATROPIUM BROMIDE 0.06 % NA SOLN
2.0000 | Freq: Three times a day (TID) | NASAL | 11 refills | Status: DC
Start: 2021-08-12 — End: 2022-05-21

## 2021-08-12 MED ORDER — FLUTICASONE PROPIONATE 50 MCG/ACT NA SUSP
2.0000 | Freq: Two times a day (BID) | NASAL | 11 refills | Status: DC
Start: 2021-08-12 — End: 2022-05-21

## 2021-08-13 NOTE — Telephone Encounter (Signed)
Pt aware refills sent to pharmacy 

## 2021-09-10 ENCOUNTER — Other Ambulatory Visit: Payer: Self-pay | Admitting: Family Medicine

## 2021-09-10 ENCOUNTER — Telehealth: Payer: Self-pay | Admitting: Family Medicine

## 2021-09-10 MED ORDER — METFORMIN HCL 500 MG PO TABS: 1000.0000 mg | ORAL_TABLET | Freq: Two times a day (BID) | ORAL | 3 refills | Status: DC

## 2021-09-10 NOTE — Telephone Encounter (Signed)
  Prescription Request  09/10/2021  Is this a "Controlled Substance" medicine? yes  Have you seen your PCP in the last 2 weeks? no  If YES, route message to pool  -  If NO, patient needs to be scheduled for appointment.  What is the name of the medication or equipment? Metformin  Have you contacted your pharmacy to request a refill? yes   Which pharmacy would you like this sent to? [facility]   Patient notified that their request is being sent to the clinical staff for review and that they should receive a response within 2 business days.

## 2021-09-10 NOTE — Telephone Encounter (Signed)
[facility] was for 500 mg 2 tabs BID Needs new Rx [facility] He was also getting his Glipizide 5 mg BID from the [place], he does not need any at this time

## 2021-09-10 NOTE — Telephone Encounter (Signed)
Please let the patient know that I sent their prescription to their pharmacy. Thanks, WS 

## 2021-09-25 ENCOUNTER — Other Ambulatory Visit: Payer: Self-pay | Admitting: Family Medicine

## 2021-09-25 MED ORDER — TAMSULOSIN HCL 0.4 MG PO CAPS: 0.4000 mg | ORAL_CAPSULE | Freq: Two times a day (BID) | ORAL | 3 refills | Status: DC

## 2021-09-25 NOTE — Telephone Encounter (Signed)
Dr. Darlyn Read,  Are you alright to manage pts tamsulosin 0.4mg ?  You seen the pt in April as a new patient and he has a follow up in Aug.

## 2021-09-25 NOTE — Telephone Encounter (Signed)
  Prescription Request  09/25/2021  Is this a "Controlled Substance" medicine? eamsulotin .04mg  solution tab Pt wants [facility] to start prescribing medication. He was getting this rx from the  Have you seen your PCP in the last 2 weeks? 06/26/2021  If YES, route message to pool  -  If NO, patient needs to be scheduled for appointment.  What is the name of the medication or equipment? eamsulotin .04mg  solution tab  Have you contacted your pharmacy to request a refill? no   [facility]   Patient notified that their request is being sent to the clinical staff for review and that they should receive a response within 2 business days.

## 2021-09-27 ENCOUNTER — Ambulatory Visit: Payer: Medicare Other | Admitting: Family Medicine

## 2021-10-11 ENCOUNTER — Ambulatory Visit (INDEPENDENT_AMBULATORY_CARE_PROVIDER_SITE_OTHER): Payer: Medicare Other | Admitting: Family Medicine

## 2021-10-11 ENCOUNTER — Encounter: Payer: Self-pay | Admitting: Family Medicine

## 2021-10-11 VITALS — BP 115/69 | HR 72 | Temp 97.5°F | Ht 69.0 in | Wt 215.6 lb

## 2021-10-11 DIAGNOSIS — E119 Type 2 diabetes mellitus without complications: Secondary | ICD-10-CM | POA: Diagnosis not present

## 2021-10-11 DIAGNOSIS — Z13220 Encounter for screening for lipoid disorders: Secondary | ICD-10-CM | POA: Diagnosis not present

## 2021-10-11 LAB — BAYER DCA HB A1C WAIVED: HB A1C (BAYER DCA - WAIVED): 6.1 % — ABNORMAL HIGH (ref 4.8–5.6)

## 2021-10-11 MED ORDER — TAMSULOSIN HCL 0.4 MG PO CAPS: 0.4000 mg | ORAL_CAPSULE | Freq: Two times a day (BID) | ORAL | 3 refills | Status: DC

## 2021-10-11 NOTE — Progress Notes (Signed)
Subjective:  Patient ID: Brian Abbott, male    DOB: 04/14/1950  Age: 71 y.o. MRN: [id number]  CC: Medical Management of Chronic Issues   HPI Brian Abbott presents for presents forFollow-up of diabetes. Patient checks blood sugar at home.   Patient denies symptoms such as polyuria, polydipsia, excessive hunger, nausea No significant hypoglycemic spells noted. Medications reviewed. Pt reports taking them regularly without complication/adverse reaction being reported today.  Lab Results  Component Value Date   HGBA1C 7.2 (H) 06/26/2021         10/11/2021    8:59 AM 10/11/2021    8:56 AM 06/26/2021   10:46 AM  Depression screen PHQ 2/9  Decreased Interest 1 0 0  Down, Depressed, Hopeless 1 0 0  PHQ - 2 Score 2 0 0  Altered sleeping 0    Tired, decreased energy 1    Change in appetite 0    Feeling bad or failure about yourself  0    Trouble concentrating 1    Moving slowly or fidgety/restless 0    Suicidal thoughts 0    PHQ-9 Score 4    Difficult doing work/chores Not difficult at all      History Brian Abbott has a past medical history of Anxiety, Diabetes mellitus without complication ([place]), Hearing loss, and Sleep apnea.   He has a past surgical history that includes Tibia fracture surgery (Right) and Hernia repair.   His family history is not on file.He reports that he has never smoked. He has never used smokeless tobacco. He reports that he does not currently use alcohol. He reports that he does not currently use drugs.    ROS Review of Systems  Constitutional:  Negative for fever.  Respiratory:  Negative for shortness of breath.   Cardiovascular:  Negative for chest pain.  Musculoskeletal:  Negative for arthralgias.  Skin:  Negative for rash.    Objective:  BP 115/69   Pulse 72   Temp (!) 97.5 F (36.4 C)   Ht $R'5\' 9"'Li$  (1.753 m)   Wt 215 lb 9.6 oz (97.8 kg)   SpO2 94%   BMI 31.84 kg/m   BP Readings from Last 3 Encounters:  10/11/21 115/69   06/26/21 102/61    Wt Readings from Last 3 Encounters:  10/11/21 215 lb 9.6 oz (97.8 kg)  06/26/21 218 lb 6.4 oz (99.1 kg)     Physical Exam Vitals reviewed.  Constitutional:      Appearance: He is well-developed.  HENT:     Head: Normocephalic and atraumatic.     Right Ear: External ear normal.     Left Ear: External ear normal.     Mouth/Throat:     Pharynx: No oropharyngeal exudate or posterior oropharyngeal erythema.  Eyes:     Pupils: Pupils are equal, round, and reactive to light.  Cardiovascular:     Rate and Rhythm: Normal rate and regular rhythm.     Heart sounds: No murmur heard. Pulmonary:     Effort: No respiratory distress.     Breath sounds: Normal breath sounds.  Musculoskeletal:     Cervical back: Normal range of motion and neck supple.  Neurological:     Mental Status: He is alert and oriented to person, place, and time.       Assessment & Plan:   [name] was seen today for medical management of chronic issues.  Diagnoses and all orders for this visit:  Diabetes mellitus without complication ([place]) -  Bayer DCA Hb A1c Waived -     CBC with Differential/Platelet -     CMP14+EGFR  Lipid screening -     Lipid panel  Other orders -     tamsulosin (FLOMAX) 0.4 MG CAPS capsule; Take 1 capsule (0.4 mg total) by mouth in the morning and at bedtime.       I have discontinued Jin Capote. Kocher "Mike"'s clonazePAM. I am also having him maintain his losartan, aspirin EC, benzonatate, cetirizine, GLIPIZIDE PO, Glucosamine HCl (GLUCOSAMINE PO), IBUPROFEN PO, Omega-3 Fatty Acids (FISH OIL PO), multivitamins ther. w/minerals, pravastatin, primidone, PARoxetine, ipratropium, fluticasone, metFORMIN, and tamsulosin.  Allergies as of 10/11/2021   No Known Allergies      Medication List        Accurate as of October 11, 2021  9:33 AM. If you have any questions, ask your nurse or doctor.          STOP taking these medications    clonazePAM 1  MG tablet Commonly known as: KLONOPIN Stopped by: Claretta Fraise, MD       TAKE these medications    aspirin EC 81 MG tablet Take 81 mg by mouth daily. Swallow whole.   benzonatate 100 MG capsule Commonly known as: TESSALON Take 100 mg by mouth 3 (three) times daily as needed for cough.   cetirizine 10 MG tablet Commonly known as: ZYRTEC Take 10 mg by mouth as needed for allergies.   FISH OIL PO Take by mouth at bedtime.   fluticasone 50 MCG/ACT nasal spray Commonly known as: FLONASE Place 2 sprays into both nostrils in the morning and at bedtime.   GLIPIZIDE PO Take by mouth in the morning and at bedtime.   GLUCOSAMINE PO Take by mouth in the morning and at bedtime.   IBUPROFEN PO Take by mouth in the morning and at bedtime.   ipratropium 0.06 % nasal spray Commonly known as: ATROVENT Place 2 sprays into both nostrils 3 (three) times daily.   losartan 50 MG tablet Commonly known as: COZAAR Take 1 tablet (50 mg total) by mouth daily.   metFORMIN 500 MG tablet Commonly known as: GLUCOPHAGE Take 2 tablets (1,000 mg total) by mouth 2 (two) times daily with a meal.   multivitamins ther. w/minerals Tabs tablet Take 1 tablet by mouth daily.   PARoxetine 40 MG tablet Commonly known as: PAXIL Take 1 tablet (40 mg total) by mouth daily.   pravastatin 40 MG tablet Commonly known as: PRAVACHOL Take 1 tablet (40 mg total) by mouth at bedtime.   primidone 50 MG tablet Commonly known as: MYSOLINE Take 2 tablets (100 mg total) by mouth at bedtime.   tamsulosin 0.4 MG Caps capsule Commonly known as: FLOMAX Take 1 capsule (0.4 mg total) by mouth in the morning and at bedtime.         Follow-up: Return in about 3 months (around 01/11/2022).  Claretta Fraise, M.D.

## 2021-10-12 LAB — CBC WITH DIFFERENTIAL/PLATELET
Basophils Absolute: 0 10*3/uL (ref 0.0–0.2)
Basos: 0 %
EOS (ABSOLUTE): 0.1 10*3/uL (ref 0.0–0.4)
Eos: 1 %
Hematocrit: 39.5 % (ref 37.5–51.0)
Hemoglobin: 13 g/dL (ref 13.0–17.7)
Immature Grans (Abs): 0 10*3/uL (ref 0.0–0.1)
Immature Granulocytes: 0 %
Lymphocytes Absolute: 1.7 10*3/uL (ref 0.7–3.1)
Lymphs: 19 %
MCH: 30.2 pg (ref 26.6–33.0)
MCHC: 32.9 g/dL (ref 31.5–35.7)
MCV: 92 fL (ref 79–97)
Monocytes Absolute: 0.7 10*3/uL (ref 0.1–0.9)
Monocytes: 7 %
Neutrophils Absolute: 6.7 10*3/uL (ref 1.4–7.0)
Neutrophils: 73 %
Platelets: 196 10*3/uL (ref 150–450)
RBC: 4.31 x10E6/uL (ref 4.14–5.80)
RDW: 12.3 % (ref 11.6–15.4)
WBC: 9.2 10*3/uL (ref 3.4–10.8)

## 2021-10-12 LAB — CMP14+EGFR
ALT: 17 IU/L (ref 0–44)
AST: 18 IU/L (ref 0–40)
Albumin/Globulin Ratio: 2 (ref 1.2–2.2)
Albumin: 4.4 g/dL (ref 3.9–4.9)
Alkaline Phosphatase: 83 IU/L (ref 44–121)
BUN/Creatinine Ratio: 12 (ref 10–24)
BUN: 16 mg/dL (ref 8–27)
Bilirubin Total: 0.3 mg/dL (ref 0.0–1.2)
CO2: 23 mmol/L (ref 20–29)
Calcium: 9.2 mg/dL (ref 8.6–10.2)
Chloride: 100 mmol/L (ref 96–106)
Creatinine, Ser: 1.34 mg/dL — ABNORMAL HIGH (ref 0.76–1.27)
Globulin, Total: 2.2 g/dL (ref 1.5–4.5)
Glucose: 138 mg/dL — ABNORMAL HIGH (ref 70–99)
Potassium: 5.1 mmol/L (ref 3.5–5.2)
Sodium: 139 mmol/L (ref 134–144)
Total Protein: 6.6 g/dL (ref 6.0–8.5)
eGFR: 57 mL/min/{1.73_m2} — ABNORMAL LOW (ref >59)

## 2021-10-12 LAB — LIPID PANEL
Chol/HDL Ratio: 3.6 ratio (ref 0.0–5.0)
Cholesterol, Total: 122 mg/dL (ref 100–199)
HDL: 34 mg/dL — ABNORMAL LOW (ref >39)
LDL Chol Calc (NIH): 61 mg/dL (ref 0–99)
Triglycerides: 158 mg/dL — ABNORMAL HIGH (ref 0–149)
VLDL Cholesterol Cal: 27 mg/dL (ref 5–40)

## 2021-10-15 NOTE — Progress Notes (Signed)
Hello Brian Abbott,  Your lab result is normal and/or stable.Some minor variations that are not significant are commonly marked abnormal, but do not represent any medical problem for you.  Best regards, Jeryn Bertoni, M.D.

## 2021-10-22 ENCOUNTER — Other Ambulatory Visit: Payer: Self-pay | Admitting: Family Medicine

## 2021-10-24 ENCOUNTER — Telehealth: Payer: Self-pay | Admitting: Family Medicine

## 2021-10-24 ENCOUNTER — Other Ambulatory Visit: Payer: Self-pay | Admitting: *Deleted

## 2021-10-24 MED ORDER — GLIPIZIDE 5 MG PO TABS: 5.0000 mg | ORAL_TABLET | Freq: Two times a day (BID) | ORAL | 3 refills | Status: DC

## 2021-10-24 NOTE — Telephone Encounter (Signed)
done

## 2021-10-24 NOTE — Telephone Encounter (Signed)
  Prescription Request  10/24/2021  Is this a "Controlled Substance" medicine? no  Have you seen your PCP in the last 2 weeks? Yes on 10/11/2021  If YES, route message to pool  -  If NO, patient needs to be scheduled for appointment.  What is the name of the medication or equipment? GLIPIZIDE 5mg   Have you contacted your pharmacy to request a refill? yes   Which pharmacy would you like this sent to? [facility]   Patient notified that their request is being sent to the clinical staff for review and that they should receive a response within 2 business days.

## 2021-11-07 ENCOUNTER — Other Ambulatory Visit: Payer: Self-pay | Admitting: Family Medicine

## 2022-01-14 ENCOUNTER — Ambulatory Visit: Payer: Medicare Other | Admitting: Family Medicine

## 2022-01-17 ENCOUNTER — Encounter: Payer: Self-pay | Admitting: Family Medicine

## 2022-01-17 ENCOUNTER — Ambulatory Visit (INDEPENDENT_AMBULATORY_CARE_PROVIDER_SITE_OTHER): Payer: Medicare Other | Admitting: Family Medicine

## 2022-01-17 VITALS — BP 138/86 | HR 71 | Temp 98.6°F | Ht 69.0 in | Wt 220.8 lb

## 2022-01-17 DIAGNOSIS — E11649 Type 2 diabetes mellitus with hypoglycemia without coma: Secondary | ICD-10-CM

## 2022-01-17 DIAGNOSIS — Z13220 Encounter for screening for lipoid disorders: Secondary | ICD-10-CM

## 2022-01-17 DIAGNOSIS — E119 Type 2 diabetes mellitus without complications: Secondary | ICD-10-CM

## 2022-01-17 LAB — CMP14+EGFR
ALT: 14 IU/L (ref 0–44)
AST: 22 IU/L (ref 0–40)
Albumin/Globulin Ratio: 2.1 (ref 1.2–2.2)
Albumin: 4.5 g/dL (ref 3.9–4.9)
Alkaline Phosphatase: 76 IU/L (ref 44–121)
BUN/Creatinine Ratio: 13 (ref 10–24)
BUN: 17 mg/dL (ref 8–27)
Bilirubin Total: 0.5 mg/dL (ref 0.0–1.2)
CO2: 24 mmol/L (ref 20–29)
Calcium: 9.2 mg/dL (ref 8.6–10.2)
Chloride: 101 mmol/L (ref 96–106)
Creatinine, Ser: 1.35 mg/dL — ABNORMAL HIGH (ref 0.76–1.27)
Globulin, Total: 2.1 g/dL (ref 1.5–4.5)
Glucose: 122 mg/dL — ABNORMAL HIGH (ref 70–99)
Potassium: 4.5 mmol/L (ref 3.5–5.2)
Sodium: 138 mmol/L (ref 134–144)
Total Protein: 6.6 g/dL (ref 6.0–8.5)
eGFR: 56 mL/min/{1.73_m2} — ABNORMAL LOW (ref >59)

## 2022-01-17 LAB — CBC WITH DIFFERENTIAL/PLATELET
Basophils Absolute: 0 10*3/uL (ref 0.0–0.2)
Basos: 0 %
EOS (ABSOLUTE): 0.1 10*3/uL (ref 0.0–0.4)
Eos: 1 %
Hematocrit: 39.5 % (ref 37.5–51.0)
Hemoglobin: 12.8 g/dL — ABNORMAL LOW (ref 13.0–17.7)
Immature Grans (Abs): 0 10*3/uL (ref 0.0–0.1)
Immature Granulocytes: 0 %
Lymphocytes Absolute: 1.2 10*3/uL (ref 0.7–3.1)
Lymphs: 16 %
MCH: 29.8 pg (ref 26.6–33.0)
MCHC: 32.4 g/dL (ref 31.5–35.7)
MCV: 92 fL (ref 79–97)
Monocytes Absolute: 0.6 10*3/uL (ref 0.1–0.9)
Monocytes: 8 %
Neutrophils Absolute: 5.9 10*3/uL (ref 1.4–7.0)
Neutrophils: 75 %
Platelets: 199 10*3/uL (ref 150–450)
RBC: 4.29 x10E6/uL (ref 4.14–5.80)
RDW: 12.1 % (ref 11.6–15.4)
WBC: 7.8 10*3/uL (ref 3.4–10.8)

## 2022-01-17 LAB — LIPID PANEL
Chol/HDL Ratio: 3.4 ratio (ref 0.0–5.0)
Cholesterol, Total: 123 mg/dL (ref 100–199)
HDL: 36 mg/dL — ABNORMAL LOW (ref >39)
LDL Chol Calc (NIH): 67 mg/dL (ref 0–99)
Triglycerides: 110 mg/dL (ref 0–149)
VLDL Cholesterol Cal: 20 mg/dL (ref 5–40)

## 2022-01-17 LAB — BAYER DCA HB A1C WAIVED: HB A1C (BAYER DCA - WAIVED): 6.9 % — ABNORMAL HIGH (ref 4.8–5.6)

## 2022-01-17 MED ORDER — SITAGLIPTIN PHOSPHATE 100 MG PO TABS: 100.0000 mg | ORAL_TABLET | Freq: Every day | ORAL | 2 refills | Status: DC

## 2022-01-17 MED ORDER — PRIMIDONE 50 MG PO TABS: 100.0000 mg | ORAL_TABLET | Freq: Every day | ORAL | 3 refills | Status: DC

## 2022-01-17 MED ORDER — FEXOFENADINE-PSEUDOEPHED ER 180-240 MG PO TB24: 1.0000 | ORAL_TABLET | Freq: Every evening | ORAL | 11 refills | Status: DC

## 2022-01-17 NOTE — Progress Notes (Signed)
Subjective:  Patient ID: Brian Abbott, male    DOB: 01/16/1951  Age: 71 y.o. MRN: [id number]  CC: Medical Management of Chronic Issues (3 month f/u)   HPI Brian Abbott presents forFollow-up of diabetes. Patient checks blood sugar at home.   127 fasting  Patient denies symptoms such as polyuria, polydipsia, excessive hunger, nausea No significant hypoglycemic spells noted. Can feel it low. Grabs something with sugar. Feels better within a few minutes. Medications reviewed. Pt reports taking them regularly without complication/adverse reaction being reported today.    History Brian Abbott has a past medical history of Anxiety, Diabetes mellitus without complication ([place]), Hearing loss, and Sleep apnea.   He has a past surgical history that includes Tibia fracture surgery (Right) and Hernia repair.   His family history is not on file.He reports that he has never smoked. He has never used smokeless tobacco. He reports that he does not currently use alcohol. He reports that he does not currently use drugs.  Current Outpatient Medications on File Prior to Visit  Medication Sig Dispense Refill   aspirin EC 81 MG tablet Take 81 mg by mouth daily. Swallow whole.     benzonatate (TESSALON) 100 MG capsule Take 100 mg by mouth 3 (three) times daily as needed for cough.     cetirizine (ZYRTEC) 10 MG tablet Take 10 mg by mouth as needed for allergies.     fluticasone (FLONASE) 50 MCG/ACT nasal spray Place 2 sprays into both nostrils in the morning and at bedtime. 18.2 mL 11   Glucosamine HCl (GLUCOSAMINE PO) Take by mouth in the morning and at bedtime.     IBUPROFEN PO Take by mouth in the morning and at bedtime.     ipratropium (ATROVENT) 0.06 % nasal spray Place 2 sprays into both nostrils 3 (three) times daily. 15 mL 11   losartan (COZAAR) 50 MG tablet Take 1 tablet (50 mg total) by mouth daily. 90 tablet 3   metFORMIN (GLUCOPHAGE) 500 MG tablet Take 2 tablets (1,000 mg total) by mouth 2  (two) times daily with a meal. 360 tablet 3   Multiple Vitamins-Minerals (MULTIVITAMINS THER. W/MINERALS) TABS tablet Take 1 tablet by mouth daily.     Omega-3 Fatty Acids (FISH OIL PO) Take by mouth at bedtime.     PARoxetine (PAXIL) 40 MG tablet Take 1 tablet (40 mg total) by mouth daily. 90 tablet 3   pravastatin (PRAVACHOL) 40 MG tablet TAKE ONE TABLET BY MOUTH AT BEDTIME 90 tablet 1   tamsulosin (FLOMAX) 0.4 MG CAPS capsule Take 1 capsule (0.4 mg total) by mouth in the morning and at bedtime. 180 capsule 3   No current facility-administered medications on file prior to visit.    ROS Review of Systems  Constitutional:  Negative for activity change, appetite change, chills and fever.  HENT:  Positive for congestion, postnasal drip, rhinorrhea and sinus pressure. Negative for ear discharge, ear pain, hearing loss, nosebleeds, sneezing and trouble swallowing.   Respiratory:  Negative for chest tightness and shortness of breath.   Cardiovascular:  Negative for chest pain and palpitations.  Skin:  Negative for rash.    Objective:  BP 138/86   Pulse 71   Temp 98.6 F (37 C)   Ht _0  (1.753 m)   Wt 220 lb 12.8 oz (100.2 kg)   SpO2 96%   BMI 32.61 kg/m   BP Readings from Last 3 Encounters:  01/17/22 138/86  10/11/21 115/69  06/26/21 102/61  Wt Readings from Last 3 Encounters:  01/17/22 220 lb 12.8 oz (100.2 kg)  10/11/21 215 lb 9.6 oz (97.8 kg)  06/26/21 218 lb 6.4 oz (99.1 kg)     Physical Exam Vitals reviewed.  Constitutional:      Appearance: He is well-developed.  HENT:     Head: Normocephalic and atraumatic.     Right Ear: External ear normal.     Left Ear: External ear normal.     Mouth/Throat:     Pharynx: No oropharyngeal exudate or posterior oropharyngeal erythema.  Eyes:     Pupils: Pupils are equal, round, and reactive to light.  Cardiovascular:     Rate and Rhythm: Normal rate and regular rhythm.     Heart sounds: No murmur heard. Pulmonary:      Effort: No respiratory distress.     Breath sounds: Normal breath sounds.  Musculoskeletal:     Cervical back: Normal range of motion and neck supple.  Neurological:     Mental Status: He is alert and oriented to person, place, and time.       Assessment & Plan:   Brian Abbott was seen today for medical management of chronic issues.  Diagnoses and all orders for this visit:  Hypoglycemia associated with diabetes (Brian Abbott)  Lipid screening -     Lipid panel  Diabetes mellitus without complication (Brian Abbott) -     Bayer DCA Hb A1c Waived -     CBC with Differential/Platelet -     CMP14+EGFR -     Microalbumin / creatinine urine ratio -     US Abdomen Complete; Future  Other orders -     primidone (MYSOLINE) 50 MG tablet; Take 2 tablets (100 mg total) by mouth at bedtime. -     sitaGLIPtin (JANUVIA) 100 MG tablet; Take 1 tablet (100 mg total) by mouth daily. -     fexofenadine-pseudoephedrine (ALLEGRA-D 24) 180-240 MG 24 hr tablet; Take 1 tablet by mouth every evening. For allergy and congestion      I have discontinued Brian Abbott. Brian "Mike"'s glipiZIDE. I am also having him start on sitaGLIPtin and fexofenadine-pseudoephedrine. Additionally, I am having him maintain his losartan, aspirin EC, benzonatate, cetirizine, Glucosamine HCl (GLUCOSAMINE PO), IBUPROFEN PO, Omega-3 Fatty Acids (FISH OIL PO), multivitamins ther. w/minerals, PARoxetine, ipratropium, fluticasone, metFORMIN, tamsulosin, pravastatin, and primidone.  Meds ordered this encounter  Medications   primidone (MYSOLINE) 50 MG tablet    Sig: Take 2 tablets (100 mg total) by mouth at bedtime.    Dispense:  180 tablet    Refill:  3   sitaGLIPtin (JANUVIA) 100 MG tablet    Sig: Take 1 tablet (100 mg total) by mouth daily.    Dispense:  30 tablet    Refill:  2   fexofenadine-pseudoephedrine (ALLEGRA-D 24) 180-240 MG 24 hr tablet    Sig: Take 1 tablet by mouth every evening. For allergy and congestion    Dispense:  30  tablet    Refill:  11     Follow-up: Return in about 3 months (around 04/19/2022).  Brian Abbott, M.D.

## 2022-01-19 LAB — MICROALBUMIN / CREATININE URINE RATIO
Creatinine, Urine: 65.3 mg/dL
Microalb/Creat Ratio: 17 mg/g creat (ref 0–29)
Microalbumin, Urine: 11 ug/mL

## 2022-01-20 NOTE — Progress Notes (Signed)
Hello Kippy,  Your lab result is normal and/or stable.Some minor variations that are not significant are commonly marked abnormal, but do not represent any medical problem for you.  Best regards, Batina Dougan, M.D.

## 2022-01-29 ENCOUNTER — Ambulatory Visit (HOSPITAL_COMMUNITY)
Admission: RE | Admit: 2022-01-29 | Discharge: 2022-01-29 | Disposition: A | Discharge status: Home / Self Care | Payer: Medicare Other | Source: Ambulatory Visit | Attending: Family Medicine | Admitting: Family Medicine

## 2022-01-29 DIAGNOSIS — R1030 Lower abdominal pain, unspecified: Secondary | ICD-10-CM | POA: Diagnosis not present

## 2022-01-29 DIAGNOSIS — E119 Type 2 diabetes mellitus without complications: Secondary | ICD-10-CM | POA: Diagnosis not present

## 2022-02-15 ENCOUNTER — Encounter: Payer: Self-pay | Admitting: Nurse Practitioner

## 2022-02-15 ENCOUNTER — Ambulatory Visit (INDEPENDENT_AMBULATORY_CARE_PROVIDER_SITE_OTHER): Payer: Medicare Other | Admitting: Nurse Practitioner

## 2022-02-15 VITALS — BP 131/84 | HR 96 | Temp 97.9°F | Resp 20 | Ht 69.0 in | Wt 212.0 lb

## 2022-02-15 DIAGNOSIS — J40 Bronchitis, not specified as acute or chronic: Secondary | ICD-10-CM | POA: Diagnosis not present

## 2022-02-15 MED ORDER — PREDNISONE 20 MG PO TABS: 40.0000 mg | ORAL_TABLET | Freq: Every day | ORAL | 0 refills | Status: AC

## 2022-02-15 MED ORDER — HYDROCODONE BIT-HOMATROP MBR 5-1.5 MG/5ML PO SOLN: 5.0000 mL | Freq: Three times a day (TID) | ORAL | 0 refills | Status: DC | PRN

## 2022-02-15 NOTE — Progress Notes (Signed)
Subjective:    Patient ID: Brian Abbott, male    DOB: 1950-08-17, 71 y.o.   MRN: [id number]   Chief Complaint: Cough and Nasal Congestion (Sick since Thanksgiving)   Cough This is a new problem. The current episode started 1 to 4 weeks ago. The problem has been waxing and waning. The cough is Productive of sputum. Associated symptoms include rhinorrhea. Pertinent negatives include no chills, ear congestion, ear pain, fever, sore throat or shortness of breath. Nothing aggravates the symptoms. He has tried prescription cough suppressant for the symptoms. The treatment provided mild relief. unable to use CPAP machine       Review of Systems  Constitutional:  Negative for chills and fever.  HENT:  Positive for rhinorrhea. Negative for ear pain and sore throat.   Respiratory:  Positive for cough. Negative for shortness of breath.        Objective:   Physical Exam Vitals reviewed.  Constitutional:      Appearance: Normal appearance.  HENT:     Right Ear: Tympanic membrane normal.     Left Ear: Tympanic membrane normal.     Nose: Congestion and rhinorrhea present.     Mouth/Throat:     Pharynx: No oropharyngeal exudate or posterior oropharyngeal erythema.  Cardiovascular:     Rate and Rhythm: Normal rate and regular rhythm.  Pulmonary:     Effort: Pulmonary effort is normal.     Breath sounds: Normal breath sounds. No wheezing or rales.  Skin:    General: Skin is warm and dry.  Neurological:     General: No focal deficit present.     Mental Status: He is oriented to person, place, and time.  Psychiatric:        Mood and Affect: Mood normal.        Behavior: Behavior normal.    BP 131/84   Pulse 96   Temp 97.9 F (36.6 C) (Temporal)   Resp 20   Ht 5\' 9"  (1.753 m)   Wt 212 lb (96.2 kg)   SpO2 98%   BMI 31.31 kg/m         Assessment & Plan:   Mccadden in today with chief complaint of Cough and Nasal Congestion (Sick since Thanksgiving)   1.  Bronchitis 1. Take meds as prescribed 2. Use a cool mist humidifier especially during the winter months and when heat has been humid. 3. Use saline nose sprays frequently 4. Saline irrigations of the nose can be very helpful if done frequently.  * 4X daily for 1 week*  * Use of a nettie pot can be helpful with this. Follow directions with this* 5. Drink plenty of fluids 6. Keep thermostat turn down low 7.For any cough or congestion- hycodan 8. For fever or aces or pains- take tylenol or ibuprofen appropriate for age and weight.  * for fevers greater than 101 orally you may alternate ibuprofen and tylenol every  3 hours.    - predniSONE (DELTASONE) 20 MG tablet; Take 2 tablets (40 mg total) by mouth daily with breakfast for 5 days. 2 po daily for 5 days  Dispense: 10 tablet; Refill: 0 - HYDROcodone bit-homatropine (HYCODAN) 5-1.5 MG/5ML syrup; Take 5 mLs by mouth every 8 (eight) hours as needed for cough.  Dispense: 120 mL; Refill: 0    The above assessment and management plan was discussed with the patient. The patient verbalized understanding of and has agreed to the management plan. Patient is aware to  call the clinic if symptoms persist or worsen. Patient is aware when to return to the clinic for a follow-up visit. Patient educated on when it is appropriate to go to the emergency department.   Mary-Margaret Hassell Done, FNP

## 2022-02-15 NOTE — Patient Instructions (Signed)
1. Take meds as prescribed 2. Use a cool mist humidifier especially during the winter months and when heat has been humid. 3. Use saline nose sprays frequently 4. Saline irrigations of the nose can be very helpful if done frequently.  * 4X daily for 1 week*  * Use of a nettie pot can be helpful with this. Follow directions with this* 5. Drink plenty of fluids 6. Keep thermostat turn down low 7.For any cough or congestion- hycodan with sedation precautions 8. For fever or aces or pains- take tylenol or ibuprofen appropriate for age and weight.  * for fevers greater than 101 orally you may alternate ibuprofen and tylenol every  3 hours.    

## 2022-03-19 ENCOUNTER — Ambulatory Visit (INDEPENDENT_AMBULATORY_CARE_PROVIDER_SITE_OTHER): Payer: Medicare Other | Admitting: Family Medicine

## 2022-03-19 ENCOUNTER — Encounter: Payer: Self-pay | Admitting: Family Medicine

## 2022-03-19 VITALS — BP 135/74 | HR 74 | Temp 97.8°F | Ht 69.0 in | Wt 219.2 lb

## 2022-03-19 DIAGNOSIS — G250 Essential tremor: Secondary | ICD-10-CM

## 2022-03-19 DIAGNOSIS — Z0000 Encounter for general adult medical examination without abnormal findings: Secondary | ICD-10-CM

## 2022-03-19 DIAGNOSIS — Z0001 Encounter for general adult medical examination with abnormal findings: Secondary | ICD-10-CM | POA: Diagnosis not present

## 2022-03-19 NOTE — Progress Notes (Signed)
Annual Wellness Visit     Patient: Brian Abbott, Male    DOB: Jul 28, 1950, 72 y.o.   MRN: [id number]  Subjective  Welcome to medicare exam  [name] is a 72 y.o. male who presents today for his Annual Wellness Visit. He reports consuming a diabetic diet. The patient does not participate in regular exercise at present. He generally feels well. He reports sleeping well. He does not have additional problems to discuss today.     Patient Active Problem List   Diagnosis Date Noted   Essential tremor 03/19/2022   Diabetes mellitus without complication ([place]) 39/76/7341   Past Medical History:  Diagnosis Date   Anxiety    Diabetes mellitus without complication ([place])    Hearing loss    Sleep apnea    Past Surgical History:  Procedure Laterality Date   HERNIA REPAIR     TIBIA FRACTURE SURGERY Right    Social History   Socioeconomic History   Marital status: Married    Spouse name: LINDA   Number of children: Not on file   Years of education: Not on file   Highest education level: Not on file  Occupational History   Not on file  Tobacco Use   Smoking status: Never   Smokeless tobacco: Never  Vaping Use   Vaping Use: Never used  Substance and Sexual Activity   Alcohol use: Not Currently   Drug use: Not Currently   Sexual activity: Not on file  Other Topics Concern   Not on file  Social History Narrative   Not on file   Social Determinants of Health   Financial Resource Strain: Low Risk  (03/19/2022)   Overall Financial Resource Strain (CARDIA)    Difficulty of Paying Living Expenses: Not hard at all  Food Insecurity: No Food Insecurity (03/19/2022)   Hunger Vital Sign    Worried About Running Out of Food in the Last Year: Never true    [place] in the Last Year: Never true  Transportation Needs: No Transportation Needs (03/19/2022)   PRAPARE - Hydrologist (Medical): No    Lack of Transportation (Non-Medical): No   Physical Activity: Unknown (03/19/2022)   Exercise Vital Sign    Days of Exercise per Week: 0 days    Minutes of Exercise per Session: Not on file  Stress: No Stress Concern Present (03/19/2022)   [place]    Feeling of Stress : Not at all  Social Connections: Moderately Integrated (03/19/2022)   Social Connection and Isolation Panel [NHANES]    Frequency of Communication with Friends and Family: More than three times a week    Frequency of Social Gatherings with Friends and Family: More than three times a week    Attends Religious Services: Never    Marine scientist or Organizations: Yes    Attends Music therapist: More than 4 times per year    Marital Status: Married  Human resources officer Violence: Not At Risk (03/19/2022)   Humiliation, Afraid, Rape, and Kick questionnaire    Fear of Current or Ex-Partner: No    Emotionally Abused: No    Physically Abused: No    Sexually Abused: No   History reviewed. No pertinent family history. No Known Allergies    Medications: Outpatient Medications Prior to Visit  Medication Sig   aspirin EC 81 MG tablet Take 81 mg by mouth daily. Swallow  whole.   fluticasone (FLONASE) 50 MCG/ACT nasal spray Place 2 sprays into both nostrils in the morning and at bedtime.   Glucosamine HCl (GLUCOSAMINE PO) Take by mouth in the morning and at bedtime.   IBUPROFEN PO Take by mouth in the morning and at bedtime.   ipratropium (ATROVENT) 0.06 % nasal spray Place 2 sprays into both nostrils 3 (three) times daily.   losartan (COZAAR) 50 MG tablet Take 1 tablet (50 mg total) by mouth daily.   metFORMIN (GLUCOPHAGE) 500 MG tablet Take 2 tablets (1,000 mg total) by mouth 2 (two) times daily with a meal.   Multiple Vitamins-Minerals (MULTIVITAMINS THER. W/MINERALS) TABS tablet Take 1 tablet by mouth daily.   Omega-3 Fatty Acids (FISH OIL PO) Take by mouth at bedtime.   PARoxetine  (PAXIL) 40 MG tablet Take 1 tablet (40 mg total) by mouth daily.   pravastatin (PRAVACHOL) 40 MG tablet TAKE ONE TABLET BY MOUTH AT BEDTIME   primidone (MYSOLINE) 50 MG tablet Take 2 tablets (100 mg total) by mouth at bedtime.   sitaGLIPtin (JANUVIA) 100 MG tablet Take 1 tablet (100 mg total) by mouth daily.   tamsulosin (FLOMAX) 0.4 MG CAPS capsule Take 1 capsule (0.4 mg total) by mouth in the morning and at bedtime.   benzonatate (TESSALON) 100 MG capsule Take 100 mg by mouth 3 (three) times daily as needed for cough. (Patient not taking: Reported on 03/19/2022)   cetirizine (ZYRTEC) 10 MG tablet Take 10 mg by mouth as needed for allergies. (Patient not taking: Reported on 03/19/2022)   [DISCONTINUED] fexofenadine-pseudoephedrine (ALLEGRA-D 24) 180-240 MG 24 hr tablet Take 1 tablet by mouth every evening. For allergy and congestion (Patient not taking: Reported on 03/19/2022)   [DISCONTINUED] HYDROcodone bit-homatropine (HYCODAN) 5-1.5 MG/5ML syrup Take 5 mLs by mouth every 8 (eight) hours as needed for cough.   No facility-administered medications prior to visit.    No Known Allergies  Patient Care Team: Mechele Claude, MD as PCP - General (Family Medicine)  Review of Systems  Constitutional:  Negative for chills, diaphoresis and fever.  HENT:  Negative for congestion and sore throat.   Eyes: Negative.   Respiratory:  Negative for cough and shortness of breath.   Cardiovascular:  Negative for chest pain and palpitations.  Gastrointestinal:  Negative for abdominal pain, diarrhea, nausea and vomiting.  Genitourinary:  Negative for dysuria.  Musculoskeletal:  Negative for joint pain.  Skin:  Negative for rash.  Neurological:  Negative for dizziness and headaches.  Psychiatric/Behavioral: Negative.          Objective  BP 135/74   Pulse 74   Temp 97.8 F (36.6 C)   Ht 5\' 9"  (1.753 m)   Wt 219 lb 3.2 oz (99.4 kg)   SpO2 94%   BMI 32.37 kg/m    Physical Exam Vitals reviewed.   Constitutional:      Appearance: He is well-developed.  HENT:     Head: Normocephalic and atraumatic.     Right Ear: External ear normal.     Left Ear: External ear normal.     Mouth/Throat:     Pharynx: No oropharyngeal exudate or posterior oropharyngeal erythema.  Eyes:     Pupils: Pupils are equal, round, and reactive to light.  Cardiovascular:     Rate and Rhythm: Normal rate and regular rhythm.     Heart sounds: No murmur heard. Pulmonary:     Effort: No respiratory distress.     Breath sounds: Normal breath  sounds.  Musculoskeletal:     Cervical back: Normal range of motion and neck supple.  Neurological:     Mental Status: He is alert and oriented to person, place, and time.       Most recent functional status assessment:    03/19/2022   11:09 AM  In your present state of health, do you have any difficulty performing the following activities:  Hearing? 1  Comment wears hearing aides  Vision? 0  Difficulty concentrating or making decisions? 0  Walking or climbing stairs? 0  Dressing or bathing? 0  Doing errands, shopping? 0  Preparing Food and eating ? N  Using the Toilet? N  In the past six months, have you accidently leaked urine? N  Do you have problems with loss of bowel control? N  Managing your Medications? N  Managing your Finances? N  Housekeeping or managing your Housekeeping? N   Most recent fall risk assessment:    03/19/2022   11:08 AM  Fall Risk   Falls in the past year? 0    Most recent depression screenings:    03/19/2022   11:09 AM 02/15/2022   11:20 AM  PHQ 2/9 Scores  PHQ - 2 Score 0 0  PHQ- 9 Score  5   Most recent cognitive screening:    03/19/2022   11:10 AM  6CIT Screen  What Year? 0 points  What month? 0 points  What time? 0 points  Count back from 20 0 points  Months in reverse 0 points  Repeat phrase 4 points  Total Score 4 points   Most recent Audit-C alcohol use screening    03/19/2022   11:15 AM  Alcohol Use  Disorder Test (AUDIT)  1. How often do you have a drink containing alcohol? 4  2. How many drinks containing alcohol do you have on a typical day when you are drinking? 0  3. How often do you have six or more drinks on one occasion? 0  AUDIT-C Score 4  4. How often during the last year have you found that you were not able to stop drinking once you had started? 0  5. How often during the last year have you failed to do what was normally expected from you because of drinking? 0  6. How often during the last year have you needed a first drink in the morning to get yourself going after a heavy drinking session? 0  7. How often during the last year have you had a feeling of guilt of remorse after drinking? 0  8. How often during the last year have you been unable to remember what happened the night before because you had been drinking? 0  9. Have you or someone else been injured as a result of your drinking? 0  10. Has a relative or friend or a doctor or another health worker been concerned about your drinking or suggested you cut down? 0  Alcohol Use Disorder Identification Test Final Score (AUDIT) 4   A score of 3 or more in women, and 4 or more in men indicates increased risk for alcohol abuse, EXCEPT if all of the points are from question 1   Vision/Hearing Screen: No results found.    No results found for any visits on 03/19/22.    Assessment & Plan   Annual wellness visit done today including the all of the following: Reviewed patient's Family Medical History Reviewed and updated list of patient's medical providers Assessment of  cognitive impairment was done Assessed patient's functional ability Established a written schedule for health screening services Health Risk Assessent Completed and Reviewed  Exercise Activities and Dietary recommendations  Goals   None     Immunization History  Administered Date(s) Administered   Fluad Quad(high Dose 65+) 12/28/2021   Influenza,  High Dose Seasonal PF 12/09/2018   PFIZER(Purple Top)SARS-COV-2 Vaccination 05/10/2019, 06/15/2019   Pneumococcal Polysaccharide-23 09/28/2012   Zoster, Live 09/28/2012    Health Maintenance  Topic Date Due   DTaP/Tdap/Td (1 - Tdap) Never done   Zoster Vaccines- Shingrix (1 of 2) Never done   COVID-19 Vaccine (3 - 2023-24 season) 04/04/2022 (Originally 11/02/2021)   Hepatitis C Screening  10/12/2022 (Originally 02/19/1969)   Pneumonia Vaccine 36+ Years old (2 - PCV) 01/18/2023 (Originally 02/20/2016)   OPHTHALMOLOGY EXAM  06/14/2022   HEMOGLOBIN A1C  07/18/2022   Diabetic kidney evaluation - eGFR measurement  01/18/2023   Diabetic kidney evaluation - Urine ACR  01/18/2023   FOOT EXAM  01/18/2023   COLONOSCOPY (Pts 45-32yrs Insurance coverage will need to be confirmed)  11/20/2027   INFLUENZA VACCINE  Completed   HPV VACCINES  Aged Out     Discussed health benefits of physical activity, and encouraged him to engage in regular exercise appropriate for his age and condition.    Problem List Items Addressed This Visit   None Visit Diagnoses     Medicare welcome visit    -  Primary   Relevant Orders   EKG 12-Lead (Completed)      VA administered shingrix X 2 and last tetanus was 10/23. Return in about 1 month (around 04/19/2022).     Claretta Fraise, MD

## 2022-03-20 ENCOUNTER — Other Ambulatory Visit: Payer: Self-pay | Admitting: Family Medicine

## 2022-04-11 LAB — HM DIABETES EYE EXAM

## 2022-04-22 ENCOUNTER — Ambulatory Visit (INDEPENDENT_AMBULATORY_CARE_PROVIDER_SITE_OTHER): Payer: Medicare Other | Admitting: Family Medicine

## 2022-04-22 ENCOUNTER — Encounter: Payer: Self-pay | Admitting: Family Medicine

## 2022-04-22 VITALS — BP 132/65 | HR 68 | Temp 97.6°F | Ht 69.0 in | Wt 223.6 lb

## 2022-04-22 DIAGNOSIS — Z7984 Long term (current) use of oral hypoglycemic drugs: Secondary | ICD-10-CM

## 2022-04-22 DIAGNOSIS — E119 Type 2 diabetes mellitus without complications: Secondary | ICD-10-CM | POA: Diagnosis not present

## 2022-04-22 DIAGNOSIS — E785 Hyperlipidemia, unspecified: Secondary | ICD-10-CM | POA: Diagnosis not present

## 2022-04-22 LAB — BAYER DCA HB A1C WAIVED: HB A1C (BAYER DCA - WAIVED): 6.7 % — ABNORMAL HIGH (ref 4.8–5.6)

## 2022-04-22 MED ORDER — SITAGLIPTIN PHOSPHATE 100 MG PO TABS: 100.0000 mg | ORAL_TABLET | Freq: Every day | ORAL | 3 refills | Status: DC

## 2022-04-22 MED ORDER — HYDROXYZINE PAMOATE 25 MG PO CAPS: 25.0000 mg | ORAL_CAPSULE | Freq: Three times a day (TID) | ORAL | 2 refills | Status: AC | PRN

## 2022-04-22 NOTE — Progress Notes (Addendum)
Subjective:  Patient ID: Brian Abbott,  male    DOB: [birth year]  Age: 72 y.o.    CC: Medical Management of Chronic Issues   HPI KENTEN FREIRE presents for  follow-up of hypertension. Patient has no history of headache chest pain or shortness of breath or recent cough. Patient also denies symptoms of TIA such as numbness weakness lateralizing. Patient denies side effects from medication. States taking it regularly.  Patient also  in for follow-up of elevated cholesterol. Doing well without complaints on current medication. Denies side effects  including myalgia and arthralgia and nausea. Also in today for liver function testing. Currently no chest pain, shortness of breath or other cardiovascular related symptoms noted.  Follow-up of diabetes. Patient does check blood sugar at home. Readings run between100 - 160 and  as high as 220  Patient denies symptoms such as excessive hunger or urinary frequency, excessive hunger, nausea No significant hypoglycemic spells noted. Medications reviewed. Pt reports taking them regularly. Pt. denies complication/adverse reaction today.   History [name] has a past medical history of Anxiety, Diabetes mellitus without complication ([place]), Hearing loss, and Sleep apnea.   He has a past surgical history that includes Tibia fracture surgery (Right) and Hernia repair.   His family history is not on file.He reports that he has never smoked. He has never used smokeless tobacco. He reports that he does not currently use alcohol. He reports that he does not currently use drugs.  Current Outpatient Medications on File Prior to Visit  Medication Sig Dispense Refill   aspirin EC 81 MG tablet Take 81 mg by mouth daily. Swallow whole.     fluticasone (FLONASE) 50 MCG/ACT nasal spray Place 2 sprays into both nostrils in the morning and at bedtime. 18.2 mL 11   Glucosamine HCl (GLUCOSAMINE PO) Take by mouth in the morning and at bedtime.     IBUPROFEN PO  Take by mouth in the morning and at bedtime.     ipratropium (ATROVENT) 0.06 % nasal spray Place 2 sprays into both nostrils 3 (three) times daily. 15 mL 11   losartan (COZAAR) 50 MG tablet Take 1 tablet (50 mg total) by mouth daily. 90 tablet 3   metFORMIN (GLUCOPHAGE) 500 MG tablet Take 2 tablets (1,000 mg total) by mouth 2 (two) times daily with a meal. 360 tablet 3   Multiple Vitamins-Minerals (MULTIVITAMINS THER. W/MINERALS) TABS tablet Take 1 tablet by mouth daily.     Omega-3 Fatty Acids (FISH OIL PO) Take by mouth at bedtime.     PARoxetine (PAXIL) 40 MG tablet Take 1 tablet (40 mg total) by mouth daily. 90 tablet 3   pravastatin (PRAVACHOL) 40 MG tablet TAKE ONE TABLET BY MOUTH AT BEDTIME 90 tablet 1   primidone (MYSOLINE) 50 MG tablet Take 2 tablets (100 mg total) by mouth at bedtime. 180 tablet 3   tamsulosin (FLOMAX) 0.4 MG CAPS capsule Take 1 capsule (0.4 mg total) by mouth in the morning and at bedtime. 180 capsule 3   No current facility-administered medications on file prior to visit.    ROS Review of Systems  Constitutional:  Negative for fever.  Respiratory:  Negative for shortness of breath.   Cardiovascular:  Negative for chest pain.  Musculoskeletal:  Negative for arthralgias.  Skin:  Negative for rash.    Objective:  BP 132/65   Pulse 68   Temp 97.6 F (36.4 C)   Ht 5' 9"$  (1.753 m)   Wt 223 lb  9.6 oz (101.4 kg)   SpO2 98%   BMI 33.02 kg/m   BP Readings from Last 3 Encounters:  04/22/22 132/65  03/19/22 135/74  02/15/22 131/84    Wt Readings from Last 3 Encounters:  04/22/22 223 lb 9.6 oz (101.4 kg)  03/19/22 219 lb 3.2 oz (99.4 kg)  02/15/22 212 lb (96.2 kg)     Physical Exam Vitals reviewed.  Constitutional:      Appearance: He is well-developed.  HENT:     Head: Normocephalic and atraumatic.     Right Ear: External ear normal.     Left Ear: External ear normal.     Mouth/Throat:     Pharynx: No oropharyngeal exudate or posterior  oropharyngeal erythema.  Eyes:     Pupils: Pupils are equal, round, and reactive to light.  Cardiovascular:     Rate and Rhythm: Normal rate and regular rhythm.     Heart sounds: No murmur heard. Pulmonary:     Effort: No respiratory distress.     Breath sounds: Normal breath sounds.  Musculoskeletal:     Cervical back: Normal range of motion and neck supple.  Neurological:     Mental Status: He is alert and oriented to person, place, and time.     Diabetic Foot Exam - Simple   No data filed     Lab Results  Component Value Date   HGBA1C 6.9 (H) 01/17/2022   HGBA1C 6.1 (H) 10/11/2021   HGBA1C 7.2 (H) 06/26/2021    Assessment & Plan:   [name] was seen today for medical management of chronic issues.  Diagnoses and all orders for this visit:  Dyslipidemia -     Lipid panel  Diabetes mellitus without complication ([place]) -     Bayer DCA Hb A1c Waived -     CBC with Differential/Platelet -     CMP14+EGFR  Other orders -     sitaGLIPtin (JANUVIA) 100 MG tablet; Take 1 tablet (100 mg total) by mouth daily.   I have changed [place]. [place] "Mike"'s Januvia to sitaGLIPtin. I am also having him maintain his losartan, aspirin EC, Glucosamine HCl (GLUCOSAMINE PO), IBUPROFEN PO, Omega-3 Fatty Acids (FISH OIL PO), multivitamins ther. w/minerals, PARoxetine, ipratropium, fluticasone, metFORMIN, tamsulosin, pravastatin, and primidone.  Meds ordered this encounter  Medications   sitaGLIPtin (JANUVIA) 100 MG tablet    Sig: Take 1 tablet (100 mg total) by mouth daily.    Dispense:  90 tablet    Refill:  3     Follow-up: Return in about 3 months (around 07/21/2022).  Claretta Fraise, M.D.

## 2022-04-22 NOTE — Addendum Note (Signed)
Addended by: Claretta Fraise on: 04/22/2022 09:25 AM   Modules accepted: Orders

## 2022-04-23 LAB — CBC WITH DIFFERENTIAL/PLATELET
Basophils Absolute: 0.1 10*3/uL (ref 0.0–0.2)
Basos: 1 %
EOS (ABSOLUTE): 0.1 10*3/uL (ref 0.0–0.4)
Eos: 1 %
Hematocrit: 39.6 % (ref 37.5–51.0)
Hemoglobin: 13 g/dL (ref 13.0–17.7)
Immature Grans (Abs): 0 10*3/uL (ref 0.0–0.1)
Immature Granulocytes: 0 %
Lymphocytes Absolute: 2.2 10*3/uL (ref 0.7–3.1)
Lymphs: 29 %
MCH: 30.1 pg (ref 26.6–33.0)
MCHC: 32.8 g/dL (ref 31.5–35.7)
MCV: 92 fL (ref 79–97)
Monocytes Absolute: 0.6 10*3/uL (ref 0.1–0.9)
Monocytes: 8 %
Neutrophils Absolute: 4.7 10*3/uL (ref 1.4–7.0)
Neutrophils: 61 %
Platelets: 209 10*3/uL (ref 150–450)
RBC: 4.32 x10E6/uL (ref 4.14–5.80)
RDW: 12.4 % (ref 11.6–15.4)
WBC: 7.7 10*3/uL (ref 3.4–10.8)

## 2022-04-23 LAB — CMP14+EGFR
ALT: 18 IU/L (ref 0–44)
AST: 23 IU/L (ref 0–40)
Albumin/Globulin Ratio: 2 (ref 1.2–2.2)
Albumin: 4.4 g/dL (ref 3.8–4.8)
Alkaline Phosphatase: 78 IU/L (ref 44–121)
BUN/Creatinine Ratio: 11 (ref 10–24)
BUN: 15 mg/dL (ref 8–27)
Bilirubin Total: 0.4 mg/dL (ref 0.0–1.2)
CO2: 23 mmol/L (ref 20–29)
Calcium: 9.6 mg/dL (ref 8.6–10.2)
Chloride: 99 mmol/L (ref 96–106)
Creatinine, Ser: 1.37 mg/dL — ABNORMAL HIGH (ref 0.76–1.27)
Globulin, Total: 2.2 g/dL (ref 1.5–4.5)
Glucose: 128 mg/dL — ABNORMAL HIGH (ref 70–99)
Potassium: 4.8 mmol/L (ref 3.5–5.2)
Sodium: 137 mmol/L (ref 134–144)
Total Protein: 6.6 g/dL (ref 6.0–8.5)
eGFR: 55 mL/min/{1.73_m2} — ABNORMAL LOW (ref >59)

## 2022-04-23 LAB — LIPID PANEL
Chol/HDL Ratio: 3.7 ratio (ref 0.0–5.0)
Cholesterol, Total: 134 mg/dL (ref 100–199)
HDL: 36 mg/dL — ABNORMAL LOW (ref >39)
LDL Chol Calc (NIH): 67 mg/dL (ref 0–99)
Triglycerides: 185 mg/dL — ABNORMAL HIGH (ref 0–149)
VLDL Cholesterol Cal: 31 mg/dL (ref 5–40)

## 2022-04-23 NOTE — Progress Notes (Signed)
Hello Jeramih,  Your lab result is normal and/or stable.Some minor variations that are not significant are commonly marked abnormal, but do not represent any medical problem for you.  Best regards, Claretta Fraise, M.D.

## 2022-05-21 ENCOUNTER — Encounter: Payer: Self-pay | Admitting: Family Medicine

## 2022-05-21 ENCOUNTER — Other Ambulatory Visit: Payer: Self-pay | Admitting: Family Medicine

## 2022-05-22 ENCOUNTER — Other Ambulatory Visit: Payer: Self-pay | Admitting: Family Medicine

## 2022-05-22 MED ORDER — PRAVASTATIN SODIUM 40 MG PO TABS: 40.0000 mg | ORAL_TABLET | Freq: Every day | ORAL | 3 refills | Status: DC

## 2022-06-14 ENCOUNTER — Other Ambulatory Visit: Payer: Self-pay | Admitting: Family Medicine

## 2022-06-26 LAB — LAB REPORT - SCANNED: EGFR: 51

## 2022-07-09 ENCOUNTER — Other Ambulatory Visit: Payer: Self-pay | Admitting: Family Medicine

## 2022-07-15 ENCOUNTER — Other Ambulatory Visit: Payer: Self-pay | Admitting: Family Medicine

## 2022-07-22 ENCOUNTER — Ambulatory Visit: Payer: Medicare Other

## 2022-07-25 ENCOUNTER — Telehealth: Payer: Self-pay | Admitting: *Deleted

## 2022-07-25 NOTE — Progress Notes (Signed)
  Care Coordination   Note   07/25/2022 Name: [name] MRN: [id number] DOB: 02/15/1951  Brian Abbott is a 72 y.o. year old male who sees Mechele Claude, MD for primary care. I reached out to Brian Abbott by phone today to offer care coordination services.  [name] was given information about Care Coordination services today including:   The Care Coordination services include support from the care team which includes your Nurse Coordinator, Clinical Social Worker, or Pharmacist.  The Care Coordination team is here to help remove barriers to the health concerns and goals most important to you. Care Coordination services are voluntary, and the patient may decline or stop services at any time by request to their care team member.   Care Coordination Consent Status: Patient agreed to services and verbal consent obtained.   Follow up plan:  Telephone appointment with care coordination team member scheduled for:  08/08/22  Encounter Outcome:  Pt. Scheduled [facility] Coordination Care Guide  Direct Dial: [phone]

## 2022-07-25 NOTE — Telephone Encounter (Signed)
Pt wants to cancel this apt.

## 2022-08-08 ENCOUNTER — Encounter: Payer: Medicare Other | Admitting: *Deleted

## 2022-08-20 ENCOUNTER — Encounter: Payer: Self-pay | Admitting: Family Medicine

## 2022-08-20 ENCOUNTER — Ambulatory Visit (INDEPENDENT_AMBULATORY_CARE_PROVIDER_SITE_OTHER): Payer: Medicare Other | Admitting: Family Medicine

## 2022-08-20 ENCOUNTER — Ambulatory Visit: Payer: Self-pay | Admitting: *Deleted

## 2022-08-20 VITALS — BP 124/67 | HR 69 | Temp 97.9°F | Ht 69.0 in | Wt 218.0 lb

## 2022-08-20 DIAGNOSIS — E119 Type 2 diabetes mellitus without complications: Secondary | ICD-10-CM

## 2022-08-20 DIAGNOSIS — E785 Hyperlipidemia, unspecified: Secondary | ICD-10-CM

## 2022-08-20 DIAGNOSIS — D509 Iron deficiency anemia, unspecified: Secondary | ICD-10-CM | POA: Diagnosis not present

## 2022-08-20 LAB — BAYER DCA HB A1C WAIVED: HB A1C (BAYER DCA - WAIVED): 6.7 % — ABNORMAL HIGH (ref 4.8–5.6)

## 2022-08-20 NOTE — Patient Outreach (Addendum)
  Care Coordination   Initial Visit Note   08/20/2022 Name: Brian Abbott MRN: [id number] DOB: 1950-07-31  Brian Abbott is a 72 y.o. year old male who sees Brian Claude, MD for primary care. I engaged with Brian Abbott in the providers office today.  What matters to the patients health and wellness today?  Voiced understanding of [facility] services   Voiced appreciation of RN CM meeting him at his pcp office  Hearing aid intact   Diabetes- Does not check cbg at home last HgAa1c= 6.7 on 04/22/22 Highest one 1 year ago at 7.2 Pending lab for today  Hypertension Does not have a blood pressure cuff at home Voices understanding that he is able to receive one via his [employer] U card benefit  Depression- reports he is coping well, continues to take his medicines and at this time does not need counseling services He relies on his faith    Goals Addressed             This Visit's Progress    [facility] care coordinator services (Diabetes, Hypertension)   On track    Interventions Today    Flowsheet Row Most Recent Value  Chronic Disease   Chronic disease during today's visit Diabetes, Hypertension (HTN), Other  [met patient at his pcp as requested today, [facility] introductions]  General Interventions   General Interventions Discussed/Reviewed General Interventions Discussed, Programmer, applications, Doctor Visits  Doctor Visits Discussed/Reviewed Doctor Visits Discussed, PCP  Education Interventions   Education Provided Provided Web-based Education, Provided Education  Provided Verbal Education On Labs, Blood Sugar Monitoring, Medication, [facility], Risk manager via my chart education on managing diabetes, hypertension, Encouraged monitoring with a glucometer and blood pressure cuff at home Discussed the access to his insurance U card for equipment]  Labs Reviewed Hgb A1c  Mental Health Interventions   Mental Health Discussed/Reviewed Mental Health Discussed,  Coping Strategies  Pharmacy Interventions   Pharmacy Dicussed/Reviewed Pharmacy Topics Discussed, Affording Medications              SDOH assessments and interventions completed:  Yes     Care Coordination Interventions:  No, not indicated   Follow up plan: Follow up call scheduled for 09/23/22    Encounter Outcome:  Pt. Visit Completed    Brian Abbott L. Noelle Penner, RN, BSN, CCM [facility] Care Management Community Coordinator Office number [phone]

## 2022-08-20 NOTE — Progress Notes (Signed)
Subjective:  Patient ID: Brian Abbott, male    DOB: 26-Dec-1950  Age: 72 y.o. MRN: [id number]  CC: Medical Management of Chronic Issues   HPI Brian Abbott presents for presents forFollow-up of diabetes. Patient denies symptoms such as polyuria, polydipsia, excessive hunger, nausea No significant hypoglycemic spells noted.Had two episodes of vomiting while eating.  Medications reviewed. Pt reports taking them regularly without complication/adverse reaction being reported today.    Lab Results  Component Value Date   HGBA1C 6.7 (H) 08/20/2022   HGBA1C 6.7 (H) 04/22/2022   HGBA1C 6.9 (H) 01/17/2022      in for follow-up of elevated cholesterol. Doing well without complaints on current medication. Denies side effects of statin including myalgia and arthralgia and nausea. Currently no chest pain, shortness of breath or other cardiovascular related symptoms noted.      08/20/2022   11:25 AM 04/22/2022    8:06 AM 03/19/2022   11:09 AM  Depression screen PHQ 2/9  Decreased Interest 0 0 0  Down, Depressed, Hopeless 0 0 0  PHQ - 2 Score 0 0 0    History Adien has a past medical history of Anxiety, Diabetes mellitus without complication (HCC), Hearing loss, and Sleep apnea.   He has a past surgical history that includes Tibia fracture surgery (Right) and Hernia repair.   His family history is not on file.He reports that he has never smoked. He has never used smokeless tobacco. He reports that he does not currently use alcohol. He reports that he does not currently use drugs.    ROS Review of Systems  Constitutional:  Negative for fever.  Respiratory:  Negative for shortness of breath.   Cardiovascular:  Negative for chest pain.  Musculoskeletal:  Negative for arthralgias.  Skin:  Negative for rash.    Objective:  BP 124/67   Pulse 69   Temp 97.9 F (36.6 C)   Ht 5\' 9"  (1.753 m)   Wt 218 lb (98.9 kg)   SpO2 94%   BMI 32.19 kg/m   BP Readings from Last 3  Encounters:  08/20/22 124/67  04/22/22 132/65  03/19/22 135/74    Wt Readings from Last 3 Encounters:  08/20/22 218 lb (98.9 kg)  04/22/22 223 lb 9.6 oz (101.4 kg)  03/19/22 219 lb 3.2 oz (99.4 kg)     Physical Exam Vitals reviewed.  Constitutional:      Appearance: He is well-developed.  HENT:     Head: Normocephalic and atraumatic.     Right Ear: External ear normal.     Left Ear: External ear normal.     Mouth/Throat:     Pharynx: No oropharyngeal exudate or posterior oropharyngeal erythema.  Eyes:     Pupils: Pupils are equal, round, and reactive to light.  Cardiovascular:     Rate and Rhythm: Normal rate and regular rhythm.     Heart sounds: No murmur heard. Pulmonary:     Effort: No respiratory distress.     Breath sounds: Normal breath sounds.  Musculoskeletal:     Cervical back: Normal range of motion and neck supple.  Neurological:     Mental Status: He is alert and oriented to person, place, and time.       Assessment & Plan:   Brian Abbott was seen today for medical management of chronic issues.  Diagnoses and all orders for this visit:  Diabetes mellitus without complication (HCC) -     Bayer DCA Hb A1c Waived -  CBC with Differential/Platelet -     CMP14+EGFR  Dyslipidemia -     Lipid panel       I am having Brian Everette. Abbott "Brian Abbott" maintain his aspirin EC, Glucosamine HCl (GLUCOSAMINE PO), IBUPROFEN PO, Omega-3 Fatty Acids (FISH OIL PO), multivitamins ther. w/minerals, metFORMIN, tamsulosin, primidone, sitaGLIPtin, hydrOXYzine, ipratropium, pravastatin, fluticasone, PARoxetine, and losartan.  Allergies as of 08/20/2022   No Known Allergies      Medication List        Accurate as of August 20, 2022  5:11 PM. If you have any questions, ask your nurse or doctor.          aspirin EC 81 MG tablet Take 81 mg by mouth daily. Swallow whole.   FISH OIL PO Take by mouth at bedtime.   fluticasone 50 MCG/ACT nasal spray Commonly known  as: FLONASE USE 2 SPRAYS IN EACH NOSTRIL TWICE DAILY   GLUCOSAMINE PO Take by mouth in the morning and at bedtime.   hydrOXYzine 25 MG capsule Commonly known as: VISTARIL Take 1 capsule (25 mg total) by mouth every 8 (eight) hours as needed.   IBUPROFEN PO Take by mouth in the morning and at bedtime.   ipratropium 0.06 % nasal spray Commonly known as: ATROVENT USE 2 SPRAYS IN EACH NOSTRIL THREE TIMES DAILY   losartan 50 MG tablet Commonly known as: COZAAR TAKE ONE (1) TABLET BY MOUTH EVERY DAY   metFORMIN 500 MG tablet Commonly known as: GLUCOPHAGE Take 2 tablets (1,000 mg total) by mouth 2 (two) times daily with a meal.   multivitamins ther. w/minerals Tabs tablet Take 1 tablet by mouth daily.   PARoxetine 40 MG tablet Commonly known as: PAXIL TAKE ONE (1) TABLET BY MOUTH EVERY DAY   pravastatin 40 MG tablet Commonly known as: PRAVACHOL Take 1 tablet (40 mg total) by mouth at bedtime.   primidone 50 MG tablet Commonly known as: MYSOLINE Take 2 tablets (100 mg total) by mouth at bedtime.   sitaGLIPtin 100 MG tablet Commonly known as: Januvia Take 1 tablet (100 mg total) by mouth daily.   tamsulosin 0.4 MG Caps capsule Commonly known as: FLOMAX Take 1 capsule (0.4 mg total) by mouth in the morning and at bedtime.         Follow-up: Return in about 3 months (around 11/20/2022).  Brian Abbott, M.D.

## 2022-08-20 NOTE — Patient Instructions (Addendum)
Visit Information  Thank you for taking time to visit with me today. Please don't hesitate to contact me if I can be of assistance to you.   Following are the goals we discussed today:   Goals Addressed             This Visit's Progress    [facility] care coordinator services (Diabetes, Hypertension)   On track    Interventions Today    Flowsheet Row Most Recent Value  Chronic Disease   Chronic disease during today's visit Diabetes, Hypertension (HTN), Other  [met patient at his pcp as requested today, [facility] introductions]  General Interventions   General Interventions Discussed/Reviewed General Interventions Discussed, Programmer, applications, Doctor Visits  Doctor Visits Discussed/Reviewed Doctor Visits Discussed, PCP  Education Interventions   Education Provided Provided Web-based Education, Provided Education  Provided Verbal Education On Labs, Blood Sugar Monitoring, Medication, [facility], Risk manager via my chart education on managing diabetes, hypertension, Encouraged monitoring with a glucometer and blood pressure cuff at home Discussed the access to his insurance U card for equipment]  Labs Reviewed Hgb A1c  Mental Health Interventions   Mental Health Discussed/Reviewed Mental Health Discussed, Coping Strategies  Pharmacy Interventions   Pharmacy Dicussed/Reviewed Pharmacy Topics Discussed, Affording Medications              Our next appointment is by telephone on 09/23/22  at 1130  Please call the care guide team at [phone] if you need to cancel or reschedule your appointment.   If you are experiencing a Mental Health or Behavioral Health Crisis or need someone to talk to, please call the Suicide and Crisis Lifeline: 988 call the [place] National Suicide Prevention Lifeline: (440) 770-3530 or TTY: 786-011-4957 TTY 445-445-4801) to talk to a trained counselor call 1-800-273-TALK (toll free, 24 hour hotline) call the [facility]:  [phone] call 911   Patient verbalizes understanding of instructions and care plan provided today and agrees to view in MyChart. Active MyChart status and patient understanding of how to access instructions and care plan via MyChart confirmed with patient.     The patient has been provided with contact information for the care management team and has been advised to call with any health related questions or concerns.   Philena Obey L. Noelle Penner, RN, BSN, CCM [facility] Care Management Community Coordinator Office number [phone]

## 2022-08-21 ENCOUNTER — Encounter: Payer: Self-pay | Admitting: Family Medicine

## 2022-08-21 LAB — CMP14+EGFR
ALT: 14 IU/L (ref 0–44)
AST: 19 IU/L (ref 0–40)
Albumin: 4.5 g/dL (ref 3.8–4.8)
Alkaline Phosphatase: 77 IU/L (ref 44–121)
BUN/Creatinine Ratio: 14 (ref 10–24)
BUN: 18 mg/dL (ref 8–27)
Bilirubin Total: 0.4 mg/dL (ref 0.0–1.2)
CO2: 22 mmol/L (ref 20–29)
Calcium: 9.6 mg/dL (ref 8.6–10.2)
Chloride: 103 mmol/L (ref 96–106)
Creatinine, Ser: 1.26 mg/dL (ref 0.76–1.27)
Globulin, Total: 2.5 g/dL (ref 1.5–4.5)
Glucose: 117 mg/dL — ABNORMAL HIGH (ref 70–99)
Potassium: 4.8 mmol/L (ref 3.5–5.2)
Sodium: 139 mmol/L (ref 134–144)
Total Protein: 7 g/dL (ref 6.0–8.5)
eGFR: 61 mL/min/{1.73_m2} (ref >59)

## 2022-08-21 LAB — LIPID PANEL
Chol/HDL Ratio: 3.6 ratio (ref 0.0–5.0)
Cholesterol, Total: 136 mg/dL (ref 100–199)
HDL: 38 mg/dL — ABNORMAL LOW (ref >39)
LDL Chol Calc (NIH): 80 mg/dL (ref 0–99)
Triglycerides: 98 mg/dL (ref 0–149)
VLDL Cholesterol Cal: 18 mg/dL (ref 5–40)

## 2022-08-21 LAB — CBC WITH DIFFERENTIAL/PLATELET
Basophils Absolute: 0 10*3/uL (ref 0.0–0.2)
Basos: 1 %
EOS (ABSOLUTE): 0.1 10*3/uL (ref 0.0–0.4)
Eos: 1 %
Hematocrit: 39.1 % (ref 37.5–51.0)
Hemoglobin: 12.8 g/dL — ABNORMAL LOW (ref 13.0–17.7)
Immature Grans (Abs): 0 10*3/uL (ref 0.0–0.1)
Immature Granulocytes: 0 %
Lymphocytes Absolute: 1.9 10*3/uL (ref 0.7–3.1)
Lymphs: 23 %
MCH: 30.1 pg (ref 26.6–33.0)
MCHC: 32.7 g/dL (ref 31.5–35.7)
MCV: 92 fL (ref 79–97)
Monocytes Absolute: 0.6 10*3/uL (ref 0.1–0.9)
Monocytes: 7 %
Neutrophils Absolute: 5.5 10*3/uL (ref 1.4–7.0)
Neutrophils: 68 %
Platelets: 224 10*3/uL (ref 150–450)
RBC: 4.25 x10E6/uL (ref 4.14–5.80)
RDW: 12.3 % (ref 11.6–15.4)
WBC: 8.1 10*3/uL (ref 3.4–10.8)

## 2022-08-23 LAB — IRON AND TIBC

## 2022-08-24 LAB — IRON AND TIBC
Iron Saturation: 26 % (ref 15–55)
Total Iron Binding Capacity: 319 ug/dL (ref 250–450)

## 2022-08-24 LAB — SPECIMEN STATUS REPORT

## 2022-08-24 LAB — FERRITIN: Ferritin: 101 ng/mL (ref 30–400)

## 2022-09-23 ENCOUNTER — Ambulatory Visit: Payer: Self-pay | Admitting: *Deleted

## 2022-09-23 NOTE — Patient Outreach (Signed)
  Care Coordination   Follow Up Visit Note   09/23/2022 Name: DERREN SUYDAM MRN: [id number] DOB: 12-25-1950  [name] is a 72 y.o. year old male who sees Mechele Claude, MD for primary care. I spoke with  [name] by phone today.  What matters to the patients health and wellness today?  Denies any medical concerns today with assessment related to his diabetes, Hypertension  Chronic kidney disease.  Agreed to follow up in the future    Goals Addressed             This Visit's Progress    [facility] care coordinator services (Diabetes, Hypertension)   On track    Interventions Today    Flowsheet Row Most Recent Value  Chronic Disease   Chronic disease during today's visit Diabetes, Hypertension (HTN), Chronic Kidney Disease/End Stage Renal Disease (ESRD)  General Interventions   General Interventions Discussed/Reviewed General Interventions Reviewed, Labs, [facility], Doctor Visits  Doctor Visits Discussed/Reviewed Doctor Visits Reviewed, PCP, Specialist  PCP/Specialist Visits Compliance with follow-up visit  Exercise Interventions   Exercise Discussed/Reviewed Exercise Discussed, Physical Activity  Physical Activity Discussed/Reviewed Physical Activity Reviewed, Home Exercise Program (HEP)  Education Interventions   Education Provided Provided Education  [[facility] services, RN CM services/ outreaches]  Provided Verbal Education On Labs, Blood Sugar Monitoring, Community Resources  Labs Reviewed Hgb A1c  Nutrition Interventions   Nutrition Discussed/Reviewed Nutrition Discussed, Decreasing salt, Decreasing sugar intake  Pharmacy Interventions   Pharmacy Dicussed/Reviewed Pharmacy Topics Reviewed, Affording Medications              SDOH assessments and interventions completed:  No     Care Coordination Interventions:  Yes, provided   Follow up plan: Follow up call scheduled for 12/24/22    Encounter Outcome:  Pt. Visit Completed   Caelyn Route L.  Noelle Penner, RN, BSN, CCM [facility] Care Management Community Coordinator Office number [phone]

## 2022-09-23 NOTE — Patient Instructions (Signed)
Visit Information  Thank you for taking time to visit with me today. Please don't hesitate to contact me if I can be of assistance to you.   Following are the goals we discussed today:   Goals Addressed             This Visit's Progress    [facility] care coordinator services (Diabetes, Hypertension)   On track    Interventions Today    Flowsheet Row Most Recent Value  Chronic Disease   Chronic disease during today's visit Diabetes, Hypertension (HTN), Chronic Kidney Disease/End Stage Renal Disease (ESRD)  General Interventions   General Interventions Discussed/Reviewed General Interventions Reviewed, Labs, [facility], Doctor Visits  Doctor Visits Discussed/Reviewed Doctor Visits Reviewed, PCP, Specialist  PCP/Specialist Visits Compliance with follow-up visit  Exercise Interventions   Exercise Discussed/Reviewed Exercise Discussed, Physical Activity  Physical Activity Discussed/Reviewed Physical Activity Reviewed, Home Exercise Program (HEP)  Education Interventions   Education Provided Provided Education  [employer] services, RN CM services/ outreaches]  Provided Verbal Education On Labs, Blood Sugar Monitoring, Community Resources  Labs Reviewed Hgb A1c  Nutrition Interventions   Nutrition Discussed/Reviewed Nutrition Discussed, Decreasing salt, Decreasing sugar intake  Pharmacy Interventions   Pharmacy Dicussed/Reviewed Pharmacy Topics Reviewed, Affording Medications              Our next appointment is by telephone on 12/24/22 at 1130  Please call the care guide team at [phone] if you need to cancel or reschedule your appointment.   If you are experiencing a Mental Health or Behavioral Health Crisis or need someone to talk to, please call the Suicide and Crisis Lifeline: 988 call the [place] National Suicide Prevention Lifeline: (763)529-6359 or TTY: (615) 041-9550 TTY 619 233 7923) to talk to a trained counselor call 1-800-273-TALK (toll free, 24 hour hotline) call  the [facility]: [phone] call 911   Patient verbalizes understanding of instructions and care plan provided today and agrees to view in MyChart. Active MyChart status and patient understanding of how to access instructions and care plan via MyChart confirmed with patient.     The patient has been provided with contact information for the care management team and has been advised to call with any health related questions or concerns.   Simra Fiebig L. Noelle Penner, RN, BSN, CCM [facility] Care Management Community Coordinator Office number [phone]

## 2022-10-15 ENCOUNTER — Other Ambulatory Visit: Payer: Self-pay | Admitting: Family Medicine

## 2022-11-21 ENCOUNTER — Encounter: Payer: Self-pay | Admitting: Family Medicine

## 2022-11-21 ENCOUNTER — Ambulatory Visit (INDEPENDENT_AMBULATORY_CARE_PROVIDER_SITE_OTHER): Payer: Medicare Other | Admitting: Family Medicine

## 2022-11-21 VITALS — BP 133/74 | HR 70 | Temp 98.1°F | Ht 69.0 in | Wt 221.0 lb

## 2022-11-21 DIAGNOSIS — E785 Hyperlipidemia, unspecified: Secondary | ICD-10-CM

## 2022-11-21 DIAGNOSIS — E119 Type 2 diabetes mellitus without complications: Secondary | ICD-10-CM | POA: Diagnosis not present

## 2022-11-21 DIAGNOSIS — I10 Essential (primary) hypertension: Secondary | ICD-10-CM

## 2022-11-21 DIAGNOSIS — Z7984 Long term (current) use of oral hypoglycemic drugs: Secondary | ICD-10-CM | POA: Diagnosis not present

## 2022-11-21 DIAGNOSIS — Z23 Encounter for immunization: Secondary | ICD-10-CM | POA: Diagnosis not present

## 2022-11-21 LAB — BAYER DCA HB A1C WAIVED: HB A1C (BAYER DCA - WAIVED): 6.4 % — ABNORMAL HIGH (ref 4.8–5.6)

## 2022-11-21 LAB — LIPID PANEL

## 2022-11-21 MED ORDER — TAMSULOSIN HCL 0.4 MG PO CAPS: 0.4000 mg | ORAL_CAPSULE | Freq: Two times a day (BID) | ORAL | 3 refills | Status: DC

## 2022-11-21 MED ORDER — PAROXETINE HCL 40 MG PO TABS: 40.0000 mg | ORAL_TABLET | Freq: Every day | ORAL | 3 refills | Status: DC

## 2022-11-21 MED ORDER — ACETIC ACID 2 % OT SOLN: 4.0000 [drp] | OTIC | 5 refills | Status: DC

## 2022-11-21 MED ORDER — SITAGLIPTIN PHOSPHATE 100 MG PO TABS: 100.0000 mg | ORAL_TABLET | Freq: Every day | ORAL | 3 refills | Status: DC

## 2022-11-21 MED ORDER — PRIMIDONE 50 MG PO TABS: 100.0000 mg | ORAL_TABLET | Freq: Every day | ORAL | 3 refills | Status: DC

## 2022-11-21 MED ORDER — METFORMIN HCL 500 MG PO TABS: 1000.0000 mg | ORAL_TABLET | Freq: Two times a day (BID) | ORAL | 3 refills | Status: DC

## 2022-11-21 NOTE — Progress Notes (Signed)
Subjective:  Patient ID: Brian Abbott,  male    DOB: 29-Sep-1950  Age: 72 y.o.    CC: Medical Management of Chronic Issues   HPI Brian Abbott presents for  follow-up of hypertension. Patient has no history of headache chest pain or shortness of breath or recent cough. Patient also denies symptoms of TIA such as numbness weakness lateralizing. Patient denies side effects from medication. States taking it regularly.  Patient also  in for follow-up of elevated cholesterol. Doing well without complaints on current medication. Denies side effects  including myalgia and arthralgia and nausea. Also in today for liver function testing. Currently no chest pain, shortness of breath or other cardiovascular related symptoms noted.  Follow-up of diabetes. Patient does not check blood sugar at home. Patient denies symptoms such as excessive hunger or urinary frequency, excessive hunger, nausea No significant hypoglycemic spells noted. Medications reviewed. Pt reports taking them regularly. Pt. denies complication/adverse reaction today.    History [name] has a past medical history of Anxiety, Diabetes mellitus without complication (HCC), Hearing loss, and Sleep apnea.   He has a past surgical history that includes Tibia fracture surgery (Right) and Hernia repair.   His family history is not on file.He reports that he has never smoked. He has never used smokeless tobacco. He reports that he does not currently use alcohol. He reports that he does not currently use drugs.  Current Outpatient Medications on File Prior to Visit  Medication Sig Dispense Refill   aspirin EC 81 MG tablet Take 81 mg by mouth daily. Swallow whole.     fluticasone (FLONASE) 50 MCG/ACT nasal spray USE 2 SPRAYS IN EACH NOSTRIL TWICE DAILY 16 g 9   Glucosamine HCl (GLUCOSAMINE PO) Take by mouth in the morning and at bedtime.     hydrOXYzine (VISTARIL) 25 MG capsule Take 1 capsule (25 mg total) by mouth every 8  (eight) hours as needed. 50 capsule 2   ipratropium (ATROVENT) 0.06 % nasal spray USE 2 SPRAYS IN EACH NOSTRIL THREE TIMES DAILY 15 mL 11   losartan (COZAAR) 50 MG tablet TAKE ONE (1) TABLET BY MOUTH EVERY DAY 90 tablet 3   Multiple Vitamins-Minerals (MULTIVITAMINS THER. W/MINERALS) TABS tablet Take 1 tablet by mouth daily.     Omega-3 Fatty Acids (FISH OIL PO) Take by mouth at bedtime.     pravastatin (PRAVACHOL) 40 MG tablet Take 1 tablet (40 mg total) by mouth at bedtime. 90 tablet 3   No current facility-administered medications on file prior to visit.    ROS Review of Systems  Constitutional:  Negative for fever.  Respiratory:  Negative for shortness of breath.   Cardiovascular:  Negative for chest pain.  Musculoskeletal:  Negative for arthralgias.  Skin:  Negative for rash.    Objective:  BP 133/74   Pulse 70   Temp 98.1 F (36.7 C)   Ht 5\' 9"  (1.753 m)   Wt 221 lb (100.2 kg)   SpO2 97%   BMI 32.64 kg/m   BP Readings from Last 3 Encounters:  11/21/22 133/74  08/20/22 124/67  04/22/22 132/65    Wt Readings from Last 3 Encounters:  11/21/22 221 lb (100.2 kg)  08/20/22 218 lb (98.9 kg)  04/22/22 223 lb 9.6 oz (101.4 kg)     Physical Exam Vitals reviewed.  Constitutional:      Appearance: He is well-developed.  HENT:     Head: Normocephalic and atraumatic.     Right Ear: External  ear normal.     Left Ear: External ear normal.     Mouth/Throat:     Pharynx: No oropharyngeal exudate or posterior oropharyngeal erythema.  Eyes:     Pupils: Pupils are equal, round, and reactive to light.  Cardiovascular:     Rate and Rhythm: Normal rate and regular rhythm.     Heart sounds: No murmur heard. Pulmonary:     Effort: No respiratory distress.     Breath sounds: Normal breath sounds.  Musculoskeletal:     Cervical back: Normal range of motion and neck supple.  Neurological:     Mental Status: He is alert and oriented to person, place, and time.     Diabetic  Foot Exam - Simple   No data filed     Lab Results  Component Value Date   HGBA1C 6.7 (H) 08/20/2022   HGBA1C 6.7 (H) 04/22/2022   HGBA1C 6.9 (H) 01/17/2022    Assessment & Plan:   Brian Abbott" was seen today for medical management of chronic issues.  Diagnoses and all orders for this visit:  Diabetes mellitus without complication (HCC) -     Bayer DCA Hb A1c Waived  Dyslipidemia -     Lipid panel  Essential hypertension -     CBC with Differential/Platelet -     CMP14+EGFR  Other orders -     metFORMIN (GLUCOPHAGE) 500 MG tablet; Take 2 tablets (1,000 mg total) by mouth 2 (two) times daily with a meal. -     PARoxetine (PAXIL) 40 MG tablet; Take 1 tablet (40 mg total) by mouth daily. -     primidone (MYSOLINE) 50 MG tablet; Take 2 tablets (100 mg total) by mouth at bedtime. -     tamsulosin (FLOMAX) 0.4 MG CAPS capsule; Take 1 capsule (0.4 mg total) by mouth in the morning and at bedtime. -     sitaGLIPtin (JANUVIA) 100 MG tablet; Take 1 tablet (100 mg total) by mouth daily. -     acetic acid 2 % otic solution; Place 4 drops into both ears every 3 (three) hours.   I have discontinued [name]. [name]'s IBUPROFEN PO. I have also changed his metFORMIN and PARoxetine. Additionally, I am having him start on acetic acid. Lastly, I am having him maintain his aspirin EC, Glucosamine HCl (GLUCOSAMINE PO), Omega-3 Fatty Acids (FISH OIL PO), multivitamins ther. w/minerals, hydrOXYzine, ipratropium, pravastatin, fluticasone, losartan, primidone, tamsulosin, and sitaGLIPtin.  Meds ordered this encounter  Medications   metFORMIN (GLUCOPHAGE) 500 MG tablet    Sig: Take 2 tablets (1,000 mg total) by mouth 2 (two) times daily with a meal.    Dispense:  360 tablet    Refill:  3   PARoxetine (PAXIL) 40 MG tablet    Sig: Take 1 tablet (40 mg total) by mouth daily.    Dispense:  90 tablet    Refill:  3   primidone (MYSOLINE) 50 MG tablet    Sig: Take 2 tablets (100 mg total)  by mouth at bedtime.    Dispense:  180 tablet    Refill:  3   tamsulosin (FLOMAX) 0.4 MG CAPS capsule    Sig: Take 1 capsule (0.4 mg total) by mouth in the morning and at bedtime.    Dispense:  180 capsule    Refill:  3   sitaGLIPtin (JANUVIA) 100 MG tablet    Sig: Take 1 tablet (100 mg total) by mouth daily.    Dispense:  90 tablet  Refill:  3   acetic acid 2 % otic solution    Sig: Place 4 drops into both ears every 3 (three) hours.    Dispense:  15 mL    Refill:  5     Follow-up: No follow-ups on file.  Mechele Claude, M.D.

## 2022-11-21 NOTE — Addendum Note (Signed)
Addended by: Adella Hare B on: 11/21/2022 05:31 PM   Modules accepted: Orders

## 2022-11-22 LAB — CBC WITH DIFFERENTIAL/PLATELET
Basophils Absolute: 0 10*3/uL (ref 0.0–0.2)
Basos: 0 %
EOS (ABSOLUTE): 0.1 10*3/uL (ref 0.0–0.4)
Eos: 1 %
Hematocrit: 39.1 % (ref 37.5–51.0)
Hemoglobin: 13.1 g/dL (ref 13.0–17.7)
Immature Grans (Abs): 0 10*3/uL (ref 0.0–0.1)
Immature Granulocytes: 0 %
Lymphocytes Absolute: 2.2 10*3/uL (ref 0.7–3.1)
Lymphs: 27 %
MCH: 31.2 pg (ref 26.6–33.0)
MCHC: 33.5 g/dL (ref 31.5–35.7)
MCV: 93 fL (ref 79–97)
Monocytes Absolute: 0.6 10*3/uL (ref 0.1–0.9)
Monocytes: 8 %
Neutrophils Absolute: 5.1 10*3/uL (ref 1.4–7.0)
Neutrophils: 64 %
Platelets: 200 10*3/uL (ref 150–450)
RBC: 4.2 x10E6/uL (ref 4.14–5.80)
RDW: 11.9 % (ref 11.6–15.4)
WBC: 8 10*3/uL (ref 3.4–10.8)

## 2022-11-22 LAB — CMP14+EGFR
ALT: 17 IU/L (ref 0–44)
AST: 19 IU/L (ref 0–40)
Albumin: 4.2 g/dL (ref 3.8–4.8)
Alkaline Phosphatase: 83 IU/L (ref 44–121)
BUN/Creatinine Ratio: 10 (ref 10–24)
BUN: 15 mg/dL (ref 8–27)
Bilirubin Total: 0.4 mg/dL (ref 0.0–1.2)
CO2: 24 mmol/L (ref 20–29)
Calcium: 9.8 mg/dL (ref 8.6–10.2)
Chloride: 103 mmol/L (ref 96–106)
Creatinine, Ser: 1.48 mg/dL — ABNORMAL HIGH (ref 0.76–1.27)
Globulin, Total: 2.4 g/dL (ref 1.5–4.5)
Glucose: 103 mg/dL — ABNORMAL HIGH (ref 70–99)
Potassium: 4.8 mmol/L (ref 3.5–5.2)
Sodium: 140 mmol/L (ref 134–144)
Total Protein: 6.6 g/dL (ref 6.0–8.5)
eGFR: 50 mL/min/{1.73_m2} — ABNORMAL LOW (ref >59)

## 2022-11-22 LAB — LIPID PANEL
Chol/HDL Ratio: 3.6 ratio (ref 0.0–5.0)
Cholesterol, Total: 130 mg/dL (ref 100–199)
HDL: 36 mg/dL — ABNORMAL LOW (ref >39)
LDL Chol Calc (NIH): 71 mg/dL (ref 0–99)
Triglycerides: 127 mg/dL (ref 0–149)
VLDL Cholesterol Cal: 23 mg/dL (ref 5–40)

## 2022-11-24 NOTE — Progress Notes (Signed)
Hello Brian Abbott,  Your lab result is normal and/or stable.Some minor variations that are not significant are commonly marked abnormal, but do not represent any medical problem for you.  Best regards, Mechele Claude, M.D.

## 2022-12-17 ENCOUNTER — Other Ambulatory Visit: Payer: Self-pay

## 2022-12-17 DIAGNOSIS — E119 Type 2 diabetes mellitus without complications: Secondary | ICD-10-CM

## 2022-12-24 ENCOUNTER — Ambulatory Visit: Payer: Self-pay | Admitting: *Deleted

## 2023-02-24 ENCOUNTER — Encounter: Payer: Self-pay | Admitting: Family Medicine

## 2023-02-24 ENCOUNTER — Ambulatory Visit (INDEPENDENT_AMBULATORY_CARE_PROVIDER_SITE_OTHER): Payer: Medicare Other | Admitting: Family Medicine

## 2023-02-24 VITALS — BP 134/72 | HR 76 | Temp 97.7°F | Ht 69.0 in | Wt 223.2 lb

## 2023-02-24 DIAGNOSIS — Z23 Encounter for immunization: Secondary | ICD-10-CM

## 2023-02-24 DIAGNOSIS — I10 Essential (primary) hypertension: Secondary | ICD-10-CM | POA: Diagnosis not present

## 2023-02-24 DIAGNOSIS — E119 Type 2 diabetes mellitus without complications: Secondary | ICD-10-CM | POA: Diagnosis not present

## 2023-02-24 DIAGNOSIS — E785 Hyperlipidemia, unspecified: Secondary | ICD-10-CM | POA: Diagnosis not present

## 2023-02-24 DIAGNOSIS — E1169 Type 2 diabetes mellitus with other specified complication: Secondary | ICD-10-CM

## 2023-02-24 LAB — CBC WITH DIFFERENTIAL/PLATELET
Basophils Absolute: 0 10*3/uL (ref 0.0–0.2)
Basos: 1 %
EOS (ABSOLUTE): 0.1 10*3/uL (ref 0.0–0.4)
Eos: 1 %
Hematocrit: 41.1 % (ref 37.5–51.0)
Hemoglobin: 13.7 g/dL (ref 13.0–17.7)
Immature Grans (Abs): 0 10*3/uL (ref 0.0–0.1)
Immature Granulocytes: 0 %
Lymphocytes Absolute: 2.4 10*3/uL (ref 0.7–3.1)
Lymphs: 29 %
MCH: 31.4 pg (ref 26.6–33.0)
MCHC: 33.3 g/dL (ref 31.5–35.7)
MCV: 94 fL (ref 79–97)
Monocytes Absolute: 0.7 10*3/uL (ref 0.1–0.9)
Monocytes: 8 %
Neutrophils Absolute: 5.2 10*3/uL (ref 1.4–7.0)
Neutrophils: 61 %
Platelets: 208 10*3/uL (ref 150–450)
RBC: 4.36 x10E6/uL (ref 4.14–5.80)
RDW: 12.4 % (ref 11.6–15.4)
WBC: 8.5 10*3/uL (ref 3.4–10.8)

## 2023-02-24 LAB — CMP14+EGFR
ALT: 23 [IU]/L (ref 0–44)
AST: 23 [IU]/L (ref 0–40)
Albumin: 4.6 g/dL (ref 3.8–4.8)
Alkaline Phosphatase: 91 [IU]/L (ref 44–121)
BUN/Creatinine Ratio: 10 (ref 10–24)
BUN: 15 mg/dL (ref 8–27)
Bilirubin Total: 0.6 mg/dL (ref 0.0–1.2)
CO2: 24 mmol/L (ref 20–29)
Calcium: 10.1 mg/dL (ref 8.6–10.2)
Chloride: 101 mmol/L (ref 96–106)
Creatinine, Ser: 1.52 mg/dL — ABNORMAL HIGH (ref 0.76–1.27)
Globulin, Total: 2.5 g/dL (ref 1.5–4.5)
Glucose: 169 mg/dL — ABNORMAL HIGH (ref 70–99)
Potassium: 4.9 mmol/L (ref 3.5–5.2)
Sodium: 139 mmol/L (ref 134–144)
Total Protein: 7.1 g/dL (ref 6.0–8.5)
eGFR: 48 mL/min/{1.73_m2} — ABNORMAL LOW (ref >59)

## 2023-02-24 LAB — LIPID PANEL
Chol/HDL Ratio: 4.3 ratio (ref 0.0–5.0)
Cholesterol, Total: 153 mg/dL (ref 100–199)
HDL: 36 mg/dL — ABNORMAL LOW
LDL Chol Calc (NIH): 82 mg/dL (ref 0–99)
Triglycerides: 208 mg/dL — ABNORMAL HIGH (ref 0–149)
VLDL Cholesterol Cal: 35 mg/dL (ref 5–40)

## 2023-02-24 LAB — BAYER DCA HB A1C WAIVED: HB A1C (BAYER DCA - WAIVED): 7.1 % — ABNORMAL HIGH (ref 4.8–5.6)

## 2023-02-24 MED ORDER — PRAVASTATIN SODIUM 40 MG PO TABS: 40.0000 mg | ORAL_TABLET | Freq: Every day | ORAL | 3 refills | Status: DC

## 2023-02-24 NOTE — Patient Instructions (Signed)
Alpha Keri, Aquaphor, Cetaphil - good lotions to help with skin care.

## 2023-02-24 NOTE — Progress Notes (Signed)
Subjective:  Patient ID: Brian Abbott,  male    DOB: 10-07-1950  Age: 72 y.o.    CC: Medical Management of Chronic Issues   HPI Brian Abbott presents for  follow-up of hypertension. Patient has no history of headache chest pain or shortness of breath or recent cough. Patient also denies symptoms of TIA such as numbness weakness lateralizing. Patient denies side effects from medication. States taking it regularly.  Patient also  in for follow-up of elevated cholesterol. Doing well without complaints on current medication. Denies side effects  including myalgia and arthralgia and nausea. Also in today for liver function testing. Currently no chest pain, shortness of breath or other cardiovascular related symptoms noted.  Follow-up of diabetes. Patient does not check blood sugar at home.  Patient denies symptoms such as excessive hunger or urinary frequency, excessive hunger, nausea No significant hypoglycemic spells noted. Medications reviewed. Pt reports taking them regularly. Pt. denies complication/adverse reaction today.    History Brian Abbott has a past medical history of Anxiety, Diabetes mellitus without complication (HCC), Hearing loss, and Sleep apnea.   Brian Abbott has a past surgical history that includes Tibia fracture surgery (Right) and Hernia repair.   His family history is not on file.Brian Abbott reports that Brian Abbott has never smoked. Brian Abbott has never used smokeless tobacco. Brian Abbott reports that Brian Abbott does not currently use alcohol. Brian Abbott reports that Brian Abbott does not currently use drugs.  Current Outpatient Medications on File Prior to Visit  Medication Sig Dispense Refill   acetic acid 2 % otic solution Place 4 drops into both ears every 3 (three) hours. 15 mL 5   aspirin EC 81 MG tablet Take 81 mg by mouth daily. Swallow whole.     fluticasone (FLONASE) 50 MCG/ACT nasal spray USE 2 SPRAYS IN EACH NOSTRIL TWICE DAILY 16 g 9   Glucosamine HCl (GLUCOSAMINE PO) Take by mouth in the morning and at bedtime.      ipratropium (ATROVENT) 0.06 % nasal spray USE 2 SPRAYS IN EACH NOSTRIL THREE TIMES DAILY 15 mL 11   losartan (COZAAR) 50 MG tablet TAKE ONE (1) TABLET BY MOUTH EVERY DAY 90 tablet 3   metFORMIN (GLUCOPHAGE) 500 MG tablet Take 2 tablets (1,000 mg total) by mouth 2 (two) times daily with a meal. 360 tablet 3   Multiple Vitamins-Minerals (MULTIVITAMINS THER. W/MINERALS) TABS tablet Take 1 tablet by mouth daily.     Omega-3 Fatty Acids (FISH OIL PO) Take by mouth at bedtime.     PARoxetine (PAXIL) 40 MG tablet Take 1 tablet (40 mg total) by mouth daily. 90 tablet 3   primidone (MYSOLINE) 50 MG tablet Take 2 tablets (100 mg total) by mouth at bedtime. 180 tablet 3   tamsulosin (FLOMAX) 0.4 MG CAPS capsule Take 1 capsule (0.4 mg total) by mouth in the morning and at bedtime. 180 capsule 3   hydrOXYzine (VISTARIL) 25 MG capsule Take 1 capsule (25 mg total) by mouth every 8 (eight) hours as needed. (Patient not taking: Reported on 02/24/2023) 50 capsule 2   sitaGLIPtin (JANUVIA) 100 MG tablet Take 1 tablet (100 mg total) by mouth daily. (Patient not taking: Reported on 02/24/2023) 90 tablet 3   No current facility-administered medications on file prior to visit.    ROS Review of Systems  Constitutional:  Negative for fever.  Respiratory:  Negative for shortness of breath.   Cardiovascular:  Negative for chest pain.  Musculoskeletal:  Negative for arthralgias.  Skin:  Negative for rash.  Objective:  BP 134/72   Pulse 76   Temp 97.7 F (36.5 C)   Ht 5\' 9"  (1.753 m)   Wt 223 lb 3.2 oz (101.2 kg)   SpO2 94%   BMI 32.96 kg/m   BP Readings from Last 3 Encounters:  02/24/23 134/72  11/21/22 133/74  08/20/22 124/67    Wt Readings from Last 3 Encounters:  02/24/23 223 lb 3.2 oz (101.2 kg)  11/21/22 221 lb (100.2 kg)  08/20/22 218 lb (98.9 kg)     Physical Exam Vitals reviewed.  Constitutional:      Appearance: Brian Abbott is well-developed.  HENT:     Head: Normocephalic and  atraumatic.     Right Ear: External ear normal.     Left Ear: External ear normal.     Mouth/Throat:     Pharynx: No oropharyngeal exudate or posterior oropharyngeal erythema.  Eyes:     Pupils: Pupils are equal, round, and reactive to light.  Cardiovascular:     Rate and Rhythm: Normal rate and regular rhythm.     Heart sounds: No murmur heard. Pulmonary:     Effort: No respiratory distress.     Breath sounds: Normal breath sounds.  Musculoskeletal:     Cervical back: Normal range of motion and neck supple.  Neurological:     Mental Status: Brian Abbott is alert and oriented to person, place, and time.     Diabetic Foot Exam - Simple   No data filed     Lab Results  Component Value Date   HGBA1C 6.4 (H) 11/21/2022   HGBA1C 6.7 (H) 08/20/2022   HGBA1C 6.7 (H) 04/22/2022    Assessment & Plan:   Brian Abbott" was seen today for medical management of chronic issues.  Diagnoses and all orders for this visit:  Diabetes mellitus without complication (HCC) -     Microalbumin / creatinine urine ratio -     Bayer DCA Hb A1c Waived  Dyslipidemia -     Lipid panel  Essential hypertension -     CBC with Differential/Platelet -     CMP14+EGFR  Other orders -     pravastatin (PRAVACHOL) 40 MG tablet; Take 1 tablet (40 mg total) by mouth at bedtime.   I am having Brian Abbott "Brian Abbott" maintain his aspirin EC, Glucosamine HCl (GLUCOSAMINE PO), Omega-3 Fatty Acids (FISH OIL PO), multivitamins ther. w/minerals, hydrOXYzine, ipratropium, fluticasone, losartan, metFORMIN, PARoxetine, primidone, tamsulosin, sitaGLIPtin, acetic acid, and pravastatin.  Meds ordered this encounter  Medications   pravastatin (PRAVACHOL) 40 MG tablet    Sig: Take 1 tablet (40 mg total) by mouth at bedtime.    Dispense:  90 tablet    Refill:  3     Follow-up: Return in about 3 months (around 05/25/2023).  Mechele Claude, M.D.

## 2023-02-24 NOTE — Addendum Note (Signed)
Addended by: Adella Hare B on: 02/24/2023 09:05 AM   Modules accepted: Orders

## 2023-02-25 LAB — MICROALBUMIN / CREATININE URINE RATIO
Creatinine, Urine: 44.8 mg/dL
Microalb/Creat Ratio: 28 mg/g{creat} (ref 0–29)
Microalbumin, Urine: 12.5 ug/mL

## 2023-03-03 ENCOUNTER — Encounter: Payer: Self-pay | Admitting: Family Medicine

## 2023-03-03 NOTE — Progress Notes (Signed)
Hello Amadou,  Your lab result is normal and/or stable.Some minor variations that are not significant are commonly marked abnormal, but do not represent any medical problem for you.  Best regards, Virna Livengood, M.D.

## 2023-03-14 ENCOUNTER — Encounter: Payer: Self-pay | Admitting: Family Medicine

## 2023-03-26 ENCOUNTER — Encounter: Payer: Self-pay | Admitting: *Deleted

## 2023-04-28 ENCOUNTER — Other Ambulatory Visit: Payer: Self-pay | Admitting: Family Medicine

## 2023-05-06 ENCOUNTER — Other Ambulatory Visit: Payer: Self-pay | Admitting: Family Medicine

## 2023-05-14 NOTE — Patient Instructions (Signed)
 Visit Information  Thank you for taking time to visit with me today. Please don't hesitate to contact me if I can be of assistance to you.   Following are the goals we discussed today:   Goals Addressed             This Visit's Progress    COMPLETED: [facility] care coordinator services (Diabetes, Hypertension)   On track    Interventions Today    Flowsheet Row Most Recent Value  Chronic Disease   Chronic disease during today's visit Diabetes, Hypertension (HTN), Chronic Kidney Disease/End Stage Renal Disease (ESRD)  [denies concerns]  General Interventions   General Interventions Discussed/Reviewed General Interventions Reviewed, Doctor Visits  Doctor Visits Discussed/Reviewed Doctor Visits Reviewed, PCP, Specialist  PCP/Specialist Visits Compliance with follow-up visit  Mental Health Interventions   Mental Health Discussed/Reviewed Mental Health Reviewed, Coping Strategies  Pharmacy Interventions   Pharmacy Dicussed/Reviewed Pharmacy Topics Reviewed, Affording Medications  Safety Interventions   Safety Discussed/Reviewed Safety Discussed, Home Safety  Home Safety Assistive Devices  Advanced Directive Interventions   Advanced Directives Discussed/Reviewed Advanced Directives Discussed  [none]              Our next appointment is by telephone on pending future follow up at n/a  Please call the care guide team at [phone] if you need to cancel or reschedule your appointment.   If you are experiencing a Mental Health or Behavioral Health Crisis or need someone to talk to, please call the Suicide and Crisis Lifeline: 988 call the [place] National Suicide Prevention Lifeline: 406-408-7969 or TTY: 939-861-9823 TTY 2706669714) to talk to a trained counselor call 1-800-273-TALK (toll free, 24 hour hotline) call the [facility]: 941-132-7381 call 911   Patient verbalizes understanding of instructions and care plan provided today and agrees to view in  MyChart. Active MyChart status and patient understanding of how to access instructions and care plan via MyChart confirmed with patient.     The patient has been provided with contact information for the care management team and has been advised to call with any health related questions or concerns.   Cephus Tupy L. Noelle Penner, RN, BSN, CCM [place]  [facility], [facility] Health RN Care Manager Direct Dial: [phone]  Fax: [phone]

## 2023-05-14 NOTE — Patient Outreach (Signed)
 Care Coordination   Follow up and case closure   Visit Note   05/14/2023 late entry for 1022/24 Name: Brian Abbott MRN: [id number] DOB: Sep 20, 1950  Brian Abbott is a 73 y.o. year old male who sees Mechele Claude, MD for primary care. I spoke with  Brian Abbott by phone today.  What matters to the patients health and wellness today?  Again patient denies medical concerns Discussed and agreed to case closure with referral in future if assistance needed     Goals Addressed             This Visit's Progress    [facility] care coordinator services (Diabetes, Hypertension)   On track    Interventions Today    Flowsheet Row Most Recent Value  Chronic Disease   Chronic disease during today's visit Diabetes, Hypertension (HTN), Chronic Kidney Disease/End Stage Renal Disease (ESRD)  [denies concerns]  General Interventions   General Interventions Discussed/Reviewed General Interventions Reviewed, Doctor Visits  Doctor Visits Discussed/Reviewed Doctor Visits Reviewed, PCP, Specialist  PCP/Specialist Visits Compliance with follow-up visit  Mental Health Interventions   Mental Health Discussed/Reviewed Mental Health Reviewed, Coping Strategies  Pharmacy Interventions   Pharmacy Dicussed/Reviewed Pharmacy Topics Reviewed, Affording Medications  Safety Interventions   Safety Discussed/Reviewed Safety Discussed, Home Safety  Home Safety Assistive Devices  Advanced Directive Interventions   Advanced Directives Discussed/Reviewed Advanced Directives Discussed  [none]              SDOH assessments and interventions completed:  No     Care Coordination Interventions:  Yes, provided   Follow up plan: No further intervention required.   Encounter Outcome:  Patient Visit Completed   Cala Bradford L. Noelle Penner, RN, BSN, CCM [place]  [facility], [facility] Health RN Care Manager Direct Dial: [phone]  Fax: [phone]

## 2023-05-19 ENCOUNTER — Other Ambulatory Visit: Payer: Self-pay | Admitting: Family Medicine

## 2023-05-19 MED ORDER — FLUTICASONE PROPIONATE 50 MCG/ACT NA SUSP: 2.0000 | Freq: Two times a day (BID) | NASAL | 9 refills | Status: DC

## 2023-05-19 NOTE — Telephone Encounter (Signed)
 Copied from CRM [id number]. Topic: Clinical - Medication Refill >> May 19, 2023 11:42 AM Fonda Kinder J wrote: Most Recent Primary Care Visit:   Medication: fluticasone (FLONASE) 50 MCG/ACT nasal spray   Has the patient contacted their pharmacy? Yes (Agent: If no, request that the patient contact the pharmacy for the refill. If patient does not wish to contact the pharmacy document the reason why and proceed with request.) (Agent: If yes, when and what did the pharmacy advise?) Contact PCP  Is this the correct pharmacy for this prescription? Yes If no, delete pharmacy and type the correct one.  This is the patient's preferred pharmacy:  [facility], [place] - [address] [place] [address] Phone: [phone] Fax: [phone]   Has the prescription been filled recently? No  Is the patient out of the medication? Yes  Has the patient been seen for an appointment in the last year OR does the patient have an upcoming appointment? Yes  Can we respond through MyChart? No  Agent: Please be advised that Rx refills may take up to 3 business days. We ask that you follow-up with your pharmacy.

## 2023-05-26 ENCOUNTER — Ambulatory Visit: Payer: Medicare Other | Admitting: Family Medicine

## 2023-05-26 DIAGNOSIS — Z13220 Encounter for screening for lipoid disorders: Secondary | ICD-10-CM

## 2023-05-26 DIAGNOSIS — E119 Type 2 diabetes mellitus without complications: Secondary | ICD-10-CM

## 2023-05-26 DIAGNOSIS — I10 Essential (primary) hypertension: Secondary | ICD-10-CM

## 2023-05-26 DIAGNOSIS — E785 Hyperlipidemia, unspecified: Secondary | ICD-10-CM

## 2023-05-28 ENCOUNTER — Other Ambulatory Visit: Payer: Self-pay | Admitting: Family Medicine

## 2023-06-04 ENCOUNTER — Encounter: Payer: Self-pay | Admitting: Family Medicine

## 2023-06-04 ENCOUNTER — Ambulatory Visit: Admitting: Family Medicine

## 2023-06-04 VITALS — BP 120/75 | HR 78 | Temp 97.9°F | Ht 69.0 in | Wt 211.0 lb

## 2023-06-04 DIAGNOSIS — J40 Bronchitis, not specified as acute or chronic: Secondary | ICD-10-CM

## 2023-06-04 DIAGNOSIS — H9202 Otalgia, left ear: Secondary | ICD-10-CM

## 2023-06-04 DIAGNOSIS — E1142 Type 2 diabetes mellitus with diabetic polyneuropathy: Secondary | ICD-10-CM

## 2023-06-04 DIAGNOSIS — R202 Paresthesia of skin: Secondary | ICD-10-CM

## 2023-06-04 DIAGNOSIS — E119 Type 2 diabetes mellitus without complications: Secondary | ICD-10-CM | POA: Diagnosis not present

## 2023-06-04 DIAGNOSIS — I10 Essential (primary) hypertension: Secondary | ICD-10-CM

## 2023-06-04 DIAGNOSIS — J329 Chronic sinusitis, unspecified: Secondary | ICD-10-CM | POA: Diagnosis not present

## 2023-06-04 DIAGNOSIS — E785 Hyperlipidemia, unspecified: Secondary | ICD-10-CM

## 2023-06-04 LAB — BAYER DCA HB A1C WAIVED: HB A1C (BAYER DCA - WAIVED): 6.4 % — ABNORMAL HIGH (ref 4.8–5.6)

## 2023-06-04 MED ORDER — ESCITALOPRAM OXALATE 10 MG PO TABS: 10.0000 mg | ORAL_TABLET | Freq: Every day | ORAL | 1 refills | Status: DC

## 2023-06-04 MED ORDER — LOSARTAN POTASSIUM 50 MG PO TABS: 50.0000 mg | ORAL_TABLET | Freq: Every day | ORAL | 3 refills | Status: DC

## 2023-06-04 MED ORDER — SITAGLIPTIN PHOSPHATE 100 MG PO TABS: 100.0000 mg | ORAL_TABLET | Freq: Every day | ORAL | 3 refills | Status: AC

## 2023-06-04 MED ORDER — MOXIFLOXACIN HCL 400 MG PO TABS: 400.0000 mg | ORAL_TABLET | Freq: Every day | ORAL | 0 refills | Status: DC

## 2023-06-04 NOTE — Progress Notes (Signed)
 Subjective:  Patient ID: Brian Abbott, male    DOB: 1950/08/23  Age: 73 y.o. MRN: [id number]  CC: Medical Management of Chronic Issues (Hands going numb while sleeping. Pt wakes up a lot because of it. Only hands and arms. ), Cough (Pt was coughing a lot. Seems better now but still ongoing. Coughs after's taking a few bites or food. ), Fussy (Pt reports that he gets irritable at work a lot and his work wants him on buspar. Sometimes work is stressful. Only occurs at work. ), and Sore (Sore in left ear. Would like to see ENT. 6 months plus. Dry sore. )   HPI Wyat Infinger Reisner presents for presents forFollow-up of diabetes. Patient does not check blood sugar at home. Patient denies symptoms such as polyuria, polydipsia, excessive hunger, nausea While he was taking glipizide at the direction of the [place] he had two significant hypoglycemic spells. None since switching med back to Januvia.   Medications reviewed. Pt reports taking them regularly without complication/adverse reaction being reported today.  Lab Results  Component Value Date   HGBA1C 7.1 (H) 02/24/2023   HGBA1C 6.4 (H) 11/21/2022   HGBA1C 6.7 (H) 08/20/2022         06/04/2023    3:10 PM 02/24/2023    8:11 AM 11/21/2022    8:35 AM  Depression screen PHQ 2/9  Decreased Interest 0 0 0  Down, Depressed, Hopeless 0 0 0  PHQ - 2 Score 0 0 0  Altered sleeping 0    Tired, decreased energy 0    Change in appetite 0    Feeling bad or failure about yourself  0    Trouble concentrating 0    Moving slowly or fidgety/restless 0    Suicidal thoughts 0    PHQ-9 Score 0    Difficult doing work/chores Not difficult at all      History Sayer has a past medical history of Anxiety, Diabetes mellitus without complication (HCC), Hearing loss, and Sleep apnea.   He has a past surgical history that includes Tibia fracture surgery (Right) and Hernia repair.   His family history is not on file.He reports that he has never smoked. He  has never used smokeless tobacco. He reports that he does not currently use alcohol. He reports that he does not currently use drugs.    ROS Review of Systems  Constitutional:  Negative for fever.  Respiratory:  Negative for shortness of breath.   Cardiovascular:  Negative for chest pain.  Musculoskeletal:  Negative for arthralgias.  Skin:  Negative for rash.    Objective:  BP 120/75   Pulse 78   Temp 97.9 F (36.6 C)   Ht 5\' 9"  (1.753 m)   Wt 211 lb (95.7 kg)   SpO2 96%   BMI 31.16 kg/m   BP Readings from Last 3 Encounters:  06/04/23 120/75  02/24/23 134/72  11/21/22 133/74    Wt Readings from Last 3 Encounters:  06/04/23 211 lb (95.7 kg)  02/24/23 223 lb 3.2 oz (101.2 kg)  11/21/22 221 lb (100.2 kg)     Physical Exam Vitals reviewed.  Constitutional:      Appearance: He is well-developed.  HENT:     Head: Normocephalic and atraumatic.     Right Ear: External ear normal.     Left Ear: External ear normal.     Mouth/Throat:     Pharynx: No oropharyngeal exudate or posterior oropharyngeal erythema.  Eyes:  Pupils: Pupils are equal, round, and reactive to light.  Cardiovascular:     Rate and Rhythm: Normal rate and regular rhythm.     Heart sounds: No murmur heard. Pulmonary:     Effort: No respiratory distress.     Breath sounds: Normal breath sounds.  Musculoskeletal:     Cervical back: Normal range of motion and neck supple.  Neurological:     Mental Status: He is alert and oriented to person, place, and time.    Diabetic Foot Exam - Simple   Simple Foot Form Diabetic Foot exam was performed with the following findings: Yes 06/04/2023  3:35 PM  Visual Inspection No deformities, no ulcerations, no other skin breakdown bilaterally: Yes Sensation Testing See comments: Yes Pulse Check Posterior Tibialis and Dorsalis pulse intact bilaterally: Yes Comments Loss of sensation noted on the left at ball and toes. On right there is loss at the great  toe.      Assessment & Plan:  Diabetic peripheral neuropathy (HCC) -     Bayer DCA Hb A1c Waived  Essential hypertension -     CBC with Differential/Platelet -     CMP14+EGFR  Dyslipidemia -     Lipid panel  Sinobronchitis  Otalgia of left ear -     Ambulatory referral to ENT  Paresthesias -     Vitamin B12 -     Folate  Other orders -     SITagliptin Phosphate; Take 1 tablet (100 mg total) by mouth daily.  Dispense: 90 tablet; Refill: 3 -     Losartan Potassium; Take 1 tablet (50 mg total) by mouth daily.  Dispense: 90 tablet; Refill: 3 -     Moxifloxacin HCl; Take 1 tablet (400 mg total) by mouth daily.  Dispense: 10 tablet; Refill: 0 -     Escitalopram Oxalate; Take 1 tablet (10 mg total) by mouth daily.  Dispense: 90 tablet; Refill: 1     Follow-up: Return in about 3 months (around 09/03/2023).  Mechele Claude, M.D.

## 2023-06-05 ENCOUNTER — Encounter: Payer: Self-pay | Admitting: Family Medicine

## 2023-06-05 ENCOUNTER — Telehealth: Payer: Self-pay

## 2023-06-05 LAB — CBC WITH DIFFERENTIAL/PLATELET
Basophils Absolute: 0 10*3/uL (ref 0.0–0.2)
Basos: 1 %
EOS (ABSOLUTE): 0.1 10*3/uL (ref 0.0–0.4)
Eos: 1 %
Hematocrit: 37 % — ABNORMAL LOW (ref 37.5–51.0)
Hemoglobin: 12.6 g/dL — ABNORMAL LOW (ref 13.0–17.7)
Immature Grans (Abs): 0 10*3/uL (ref 0.0–0.1)
Immature Granulocytes: 0 %
Lymphocytes Absolute: 2.2 10*3/uL (ref 0.7–3.1)
Lymphs: 27 %
MCH: 31.3 pg (ref 26.6–33.0)
MCHC: 34.1 g/dL (ref 31.5–35.7)
MCV: 92 fL (ref 79–97)
Monocytes Absolute: 0.6 10*3/uL (ref 0.1–0.9)
Monocytes: 8 %
Neutrophils Absolute: 5 10*3/uL (ref 1.4–7.0)
Neutrophils: 63 %
Platelets: 218 10*3/uL (ref 150–450)
RBC: 4.03 x10E6/uL — ABNORMAL LOW (ref 4.14–5.80)
RDW: 12.2 % (ref 11.6–15.4)
WBC: 8 10*3/uL (ref 3.4–10.8)

## 2023-06-05 LAB — CMP14+EGFR
ALT: 19 IU/L (ref 0–44)
AST: 22 IU/L (ref 0–40)
Albumin: 4.5 g/dL (ref 3.8–4.8)
Alkaline Phosphatase: 75 IU/L (ref 44–121)
BUN/Creatinine Ratio: 9 — ABNORMAL LOW (ref 10–24)
BUN: 12 mg/dL (ref 8–27)
Bilirubin Total: 0.3 mg/dL (ref 0.0–1.2)
CO2: 23 mmol/L (ref 20–29)
Calcium: 9.8 mg/dL (ref 8.6–10.2)
Chloride: 102 mmol/L (ref 96–106)
Creatinine, Ser: 1.28 mg/dL — ABNORMAL HIGH (ref 0.76–1.27)
Globulin, Total: 2.3 g/dL (ref 1.5–4.5)
Glucose: 86 mg/dL (ref 70–99)
Potassium: 4.6 mmol/L (ref 3.5–5.2)
Sodium: 139 mmol/L (ref 134–144)
Total Protein: 6.8 g/dL (ref 6.0–8.5)
eGFR: 59 mL/min/{1.73_m2} — ABNORMAL LOW (ref >59)

## 2023-06-05 LAB — LIPID PANEL
Chol/HDL Ratio: 4 ratio (ref 0.0–5.0)
Cholesterol, Total: 139 mg/dL (ref 100–199)
HDL: 35 mg/dL — ABNORMAL LOW (ref >39)
LDL Chol Calc (NIH): 78 mg/dL (ref 0–99)
Triglycerides: 146 mg/dL (ref 0–149)
VLDL Cholesterol Cal: 26 mg/dL (ref 5–40)

## 2023-06-05 NOTE — Telephone Encounter (Signed)
 Copied from CRM [id number]. Topic: Clinical - Medication Question >> Jun 05, 2023  8:55 AM Ivette P wrote: Reason for CRM: PT called in because he was prescribed  escitalopram (LEXAPRO) 10 MG tablet moxifloxacin (AVELOX) 400 MG tablet, pt does not remember being prescribed this medication or talking about these. Pt would like for someone to go over with and also has additional questions.   PT callback [phone]

## 2023-06-05 NOTE — Telephone Encounter (Signed)
 Thank you. Pt informed. LS

## 2023-06-05 NOTE — Progress Notes (Signed)
Hello Amadou,  Your lab result is normal and/or stable.Some minor variations that are not significant are commonly marked abnormal, but do not represent any medical problem for you.  Best regards, Virna Livengood, M.D.

## 2023-06-05 NOTE — Telephone Encounter (Signed)
 Moxifloxacin is avelox an antibiotic for his cough, etc. The escitalopram is for his work anxiety and irritability. It replaces paxil (paroxetine)

## 2023-06-06 LAB — FOLATE: Folate: 18.8 ng/mL (ref >3.0)

## 2023-06-06 LAB — SPECIMEN STATUS REPORT

## 2023-06-06 LAB — VITAMIN B12: Vitamin B-12: 373 pg/mL (ref 232–1245)

## 2023-06-24 ENCOUNTER — Ambulatory Visit

## 2023-06-24 ENCOUNTER — Ambulatory Visit (INDEPENDENT_AMBULATORY_CARE_PROVIDER_SITE_OTHER)

## 2023-06-24 DIAGNOSIS — E782 Mixed hyperlipidemia: Secondary | ICD-10-CM

## 2023-06-24 DIAGNOSIS — Z7984 Long term (current) use of oral hypoglycemic drugs: Secondary | ICD-10-CM | POA: Diagnosis not present

## 2023-06-24 DIAGNOSIS — E1169 Type 2 diabetes mellitus with other specified complication: Secondary | ICD-10-CM | POA: Diagnosis not present

## 2023-06-24 LAB — HM DIABETES EYE EXAM

## 2023-06-24 NOTE — Progress Notes (Signed)
 Brian Abbott arrived 06/24/2023 and has given verbal consent to obtain images and complete their overdue diabetic retinal screening.  The images have been sent to an ophthalmologist or optometrist for review and interpretation.  Results will be sent back to Roise Cleaver, MD for review.  Patient has been informed they will be contacted when we receive the results via telephone or MyChart

## 2023-06-30 ENCOUNTER — Encounter: Payer: Self-pay | Admitting: Family Medicine

## 2023-07-10 ENCOUNTER — Ambulatory Visit (INDEPENDENT_AMBULATORY_CARE_PROVIDER_SITE_OTHER): Admitting: Audiology

## 2023-07-10 ENCOUNTER — Ambulatory Visit (INDEPENDENT_AMBULATORY_CARE_PROVIDER_SITE_OTHER): Admitting: Physician Assistant

## 2023-07-10 ENCOUNTER — Encounter (INDEPENDENT_AMBULATORY_CARE_PROVIDER_SITE_OTHER): Payer: Self-pay | Admitting: Physician Assistant

## 2023-07-10 VITALS — BP 159/82 | HR 65 | Ht 69.0 in | Wt 200.0 lb

## 2023-07-10 DIAGNOSIS — Z09 Encounter for follow-up examination after completed treatment for conditions other than malignant neoplasm: Secondary | ICD-10-CM

## 2023-07-10 DIAGNOSIS — H903 Sensorineural hearing loss, bilateral: Secondary | ICD-10-CM | POA: Diagnosis not present

## 2023-07-10 DIAGNOSIS — Z8669 Personal history of other diseases of the nervous system and sense organs: Secondary | ICD-10-CM

## 2023-07-10 DIAGNOSIS — L308 Other specified dermatitis: Secondary | ICD-10-CM

## 2023-07-10 NOTE — Progress Notes (Unsigned)
 Dear Dr. Veleta Gerold, Here is my assessment for our mutual patient, Brian Abbott. Thank you for allowing me the opportunity to care for your patient. Please do not hesitate to contact me should you have any other questions. Sincerely, Belma Boxer PA-C  Otolaryngology Clinic Note Referring provider: Dr. Veleta Gerold HPI:  Brian Abbott is a 73 y.o. male kindly referred by Dr. Veleta Gerold   The patient is a 73 year old gentleman seen in our office for evaluation of left ear itchiness.  The the patient notes that approximately 2 months ago he started having itching in his left ear, no associated drainage, no pain, no pressure.  The patient notes a significant past medical history of hearing loss, he has had hearing aids for the last 10 years.  He notes that he was originally seen at [facility], he had asymmetric hearing loss left greater than right.  He reports an MRI that was negative.  Chart review shows he did have an MRI in 2001.  He denies any significant changes to his hearing.  He was having an issue with his left hearing aid but saw audiology today who adjusted it back to normal.  He notes for his itching of his ears he uses Q-tips which slightly calm to the itching but it returns quickly.  He has used acetic acid  prior for the itchiness.  He denies any drainage from the ear.     Independent Review of Additional Tests or Records:    06/04/2022- ***    PMH/Meds/All/SocHx/FamHx/ROS:   Past Medical History:  Diagnosis Date   Anxiety    Diabetes mellitus without complication (HCC)    Hearing loss    Sleep apnea      Past Surgical History:  Procedure Laterality Date   HERNIA REPAIR     TIBIA FRACTURE SURGERY Right     History reviewed. No pertinent family history.   Social Connections: Moderately Integrated (02/23/2023)   Social Connection and Isolation Panel [NHANES]    Frequency of Communication with Friends and Family: Three times a week    Frequency of Social Gatherings with  Friends and Family: Patient declined    Attends Religious Services: More than 4 times per year    Active Member of [employer] or Organizations: No    Attends Engineer, structural: Not on file    Marital Status: Married      Current Outpatient Medications:    acetic acid  2 % otic solution, Place 4 drops into both ears every 3 (three) hours., Disp: 15 mL, Rfl: 5   aspirin EC 81 MG tablet, Take 81 mg by mouth daily. Swallow whole., Disp: , Rfl:    b complex vitamins capsule, Take 1 capsule by mouth daily., Disp: , Rfl:    escitalopram  (LEXAPRO ) 10 MG tablet, Take 1 tablet (10 mg total) by mouth daily., Disp: 90 tablet, Rfl: 1   fluticasone  (FLONASE ) 50 MCG/ACT nasal spray, Place 2 sprays into both nostrils 2 (two) times daily., Disp: 16 g, Rfl: 9   Glucosamine HCl (GLUCOSAMINE PO), Take by mouth in the morning and at bedtime., Disp: , Rfl:    hydrOXYzine  (VISTARIL ) 25 MG capsule, Take 1 capsule (25 mg total) by mouth every 8 (eight) hours as needed., Disp: 50 capsule, Rfl: 2   ipratropium (ATROVENT ) 0.06 % nasal spray, USE 2 SPRAYS IN EACH NOSTRIL THREE TIMES DAILY, Disp: 15 mL, Rfl: 11   losartan  (COZAAR ) 50 MG tablet, Take 1 tablet (50 mg total) by mouth daily., Disp: 90 tablet,  Rfl: 3   metFORMIN  (GLUCOPHAGE ) 500 MG tablet, Take 2 tablets (1,000 mg total) by mouth 2 (two) times daily with a meal., Disp: 360 tablet, Rfl: 3   moxifloxacin  (AVELOX ) 400 MG tablet, Take 1 tablet (400 mg total) by mouth daily., Disp: 10 tablet, Rfl: 0   Multiple Vitamins-Minerals (MULTIVITAMINS THER. W/MINERALS) TABS tablet, Take 1 tablet by mouth daily., Disp: , Rfl:    Omega-3 Fatty Acids (FISH OIL PO), Take by mouth at bedtime., Disp: , Rfl:    PARoxetine  (PAXIL ) 40 MG tablet, Take 1 tablet (40 mg total) by mouth daily., Disp: 90 tablet, Rfl: 3   primidone  (MYSOLINE ) 50 MG tablet, Take 2 tablets (100 mg total) by mouth at bedtime., Disp: 180 tablet, Rfl: 3   sitaGLIPtin  (JANUVIA ) 100 MG tablet, Take 1 tablet  (100 mg total) by mouth daily., Disp: 90 tablet, Rfl: 3   tamsulosin  (FLOMAX ) 0.4 MG CAPS capsule, Take 1 capsule (0.4 mg total) by mouth in the morning and at bedtime., Disp: 180 capsule, Rfl: 3   Physical Exam:   BP (!) 159/82   Pulse 65   Ht 5\' 9"  (1.753 m)   Wt 200 lb (90.7 kg)   SpO2 96%   BMI 29.53 kg/m   Pertinent Findings  CN II-XII intact Bilateral EAC clear and TM intact with well pneumatized middle ear spaces, no drainage Anterior rhinoscopy: Septum midline; bilateral inferior turbinates with mild hypertrophy No lesions of oral cavity/oropharynx; dentition WNL, absent tonsils   No obviously palpable neck masses/lymphadenopathy/thyromegaly No respiratory distress or stridor    Seprately Identifiable Procedures:  None  Impression & Plans:  Brian Abbott is a 73 y.o. male with the following   Pruritus-   The patient describes a history of ear itching.  He has a normal physical exam today, no signs of infection or significant excoriation.  He describes using Q-tips as this seems to temporize his issue but it reoccurs.  I advised him to avoid using Q-tips in his ear, he may use sweet oil as needed to see if this helps relieve some of the itchiness.  If he has any severe symptoms or changes to the characteristics I would like to see him back in the office for repeat evaluation.  Hearing loss-  Although the patient did not discuss his hearing loss today and audiological evaluation was performed given his malfunctioning left hearing aid as well as the ear related complaints.  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  He notes this is longstanding, he has had MRI workup previously with no identifiable lesion.  He denies any changes to his hearing.  He will continue to monitor return for any changes and return as needed.   - f/u as needed   Thank you for allowing me the opportunity to care for your patient. Please do not hesitate to  contact me should you have any other questions.  Sincerely, Belma Boxer PA-C [facility] Phone: [phone] Fax: [phone]  07/10/2023, 9:30 AM

## 2023-07-10 NOTE — Progress Notes (Signed)
  [address], [address] [place], [place] [address] [phone]  Audiological Evaluation    Name: Brian Abbott     DOB:   1950-03-21      MRN:   [id number]                                                                                     Service Date: 07/10/2023       Patient comes today after Lorane Rocker, PA-C sent a referral for a hearing evaluation due to concerns with ear pain.   Symptoms Yes Details  Hearing loss  [x]  Known hearing loss in worse in the left ear  Tinnitus  []    Ear pain/ infections/pressure  [x]  Left ear pain as her wears his custom hearing aids  Balance problems  []    Noise exposure history  []    Previous ear surgeries  []    Family history of hearing loss  []    Amplification  [x]  Has a set that was fit at the [place]. May have some left side retention issue/pressure point and then feedback issues.   Other  []      Otoscopy: Right ear: Clear external ear canals and notable landmarks visualized on the tympanic membrane. Left ear:  Clear external ear canals and notable landmarks visualized on the tympanic membrane.  Tympanometry: Right ear: Type A- Normal external ear canal volume with normal middle ear pressure and tympanic membrane compliance. Left ear: Type A- Normal external ear canal volume with normal middle ear pressure and tympanic membrane compliance.    Pure tone Audiometry: Right ear- Mild to severe sensorineural hearing loss from 125 Hz - 8000 Hz. Left ear-  Moderately severe to profound essentially sensorineural hearing loss from 125 Hz - 8000 Hz.  Speech Audiometry: Right ear- Speech Reception Threshold (SRT) was obtained at 60 dBHL. Left ear-Speech Reception Threshold (SRT) was obtained at 80 dBHL.   Word Recognition Score Tested using NU-6 (MLV) Right ear: 88% was obtained at a presentation level of 95 dBHL with contralateral masking which is deemed as  good . Left ear:  84% was obtained at a presentation level of 105 dBHL with  contralateral masking which is deemed as  good .   The hearing test results were completed under headphones and results are deemed to be of good to fair reliability. Test technique:  conventional    Recommendations: Follow up with ENT as scheduled for today. Return for a hearing evaluation if concerns with hearing changes arise or per MD recommendation. Consider mentioning the issue to the [place] - to see which alternatives the can provide to change the fit of the custom aid.    Carlynn Leduc MARIE LEROUX-MARTINEZ, AUD

## 2023-07-10 NOTE — Patient Instructions (Signed)
 To help with your buildup of earwax try using sweet oil. This can be purchased over the counter at your local pharmacy. Using a sterilized ear dropper, place a few drops into your ear. Cover the ear with a cotton ball, or warm compress, for 5 to 10 minutes. Rub gently. Wipe out any excess ear wax, and the oil, with a cotton ball or a wet cloth.

## 2023-07-21 ENCOUNTER — Encounter: Payer: Self-pay | Admitting: Audiology

## 2023-08-06 ENCOUNTER — Ambulatory Visit

## 2023-08-06 VITALS — BP 159/82 | HR 65 | Ht 69.0 in | Wt 200.0 lb

## 2023-08-06 DIAGNOSIS — Z0000 Encounter for general adult medical examination without abnormal findings: Secondary | ICD-10-CM | POA: Diagnosis not present

## 2023-08-06 NOTE — Patient Instructions (Signed)
 Brian Abbott , Thank you for taking time out of your busy schedule to complete your Annual Wellness Visit with me. I enjoyed our conversation and look forward to speaking with you again next year. I, as well as your care team,  appreciate your ongoing commitment to your health goals. Please review the following plan we discussed and let me know if I can assist you in the future. Your Game plan/ To Do List    Follow up Visits: Next Medicare AWV with our clinical staff: 08/09/24 at 1:50p.m.   Have you seen your provider in the last 6 months (3 months if uncontrolled diabetes)? Yes Next Office Visit with your provider: 09/03/23 at 9:55a.m.  Clinician Recommendations:  Aim for 30 minutes of exercise or brisk walking, 6-8 glasses of water, and 5 servings of fruits and vegetables each day. Please remember to get your 2nd shingles vaccine at your next office visit.      This is a list of the screening recommended for you and due dates:  Health Maintenance  Topic Date Due   Zoster (Shingles) Vaccine (2 of 2) 09/03/2023*   Hepatitis C Screening  11/21/2023*   COVID-19 Vaccine (3 - 2024-25 season) 08/21/2024*   Flu Shot  10/03/2023   Hemoglobin A1C  12/04/2023   Yearly kidney health urinalysis for diabetes  02/24/2024   Yearly kidney function blood test for diabetes  06/03/2024   Complete foot exam   06/03/2024   Eye exam for diabetics  06/23/2024   Medicare Annual Wellness Visit  08/05/2024   Colon Cancer Screening  12/07/2026   DTaP/Tdap/Td vaccine (2 - Td or Tdap) 11/20/2032   Pneumonia Vaccine  Completed   HPV Vaccine  Aged Out   Meningitis B Vaccine  Aged Out  *Topic was postponed. The date shown is not the original due date.    Advanced directives: (Declined) Advance directive discussed with you today. Even though you declined this today, please call our office should you change your mind, and we can give you the proper paperwork for you to fill out. Advance Care Planning is important  because it:  [x]  Makes sure you receive the medical care that is consistent with your values, goals, and preferences  [x]  It provides guidance to your family and loved ones and reduces their decisional burden about whether or not they are making the right decisions based on your wishes.  Follow the link provided in your after visit summary or read over the paperwork we have mailed to you to help you started getting your Advance Directives in place. If you need assistance in completing these, please reach out to us  so that we can help you!  See attachments for Preventive Care and Fall Prevention Tips.

## 2023-08-06 NOTE — Progress Notes (Signed)
 Subjective:   Brian Abbott is a 73 y.o. who presents for a Medicare Wellness preventive visit.  As a reminder, Annual Wellness Visits don't include a physical exam, and some assessments may be limited, especially if this visit is performed virtually. We may recommend an in-person follow-up visit with your provider if needed.  Visit Complete: Virtual I connected with  Brian Abbott on 08/06/23 by a audio enabled telemedicine application and verified that I am speaking with the correct person using two identifiers.  Patient Location: Home  Provider Location: Home Office  I discussed the limitations of evaluation and management by telemedicine. The patient expressed understanding and agreed to proceed.  Vital Signs: Because this visit was a virtual/telehealth visit, some criteria may be missing or patient reported. Any vitals not documented were not able to be obtained and vitals that have been documented are patient reported.  VideoDeclined- This patient declined Librarian, academic. Therefore the visit was completed with audio only.  Persons Participating in Visit: Patient.  AWV Questionnaire: No: Patient Medicare AWV questionnaire was not completed prior to this visit.  Cardiac Risk Factors include: advanced age (>42men, >64 women);diabetes mellitus;dyslipidemia;male gender     Objective:     Today's Vitals   08/06/23 1353  BP: (!) 159/82  Pulse: 65  Weight: 200 lb (90.7 kg)  Height: 5\' 9"  (1.753 m)   Body mass index is 29.53 kg/m.     08/06/2023    2:02 PM 03/19/2022   11:08 AM  Advanced Directives  Does Patient Have a Medical Advance Directive? No No  Would patient like information on creating a medical advance directive?  No - Patient declined    Current Medications (verified) Outpatient Encounter Medications as of 08/06/2023  Medication Sig   acetic acid  2 % otic solution Place 4 drops into both ears every 3 (three) hours.    aspirin EC 81 MG tablet Take 81 mg by mouth daily. Swallow whole.   b complex vitamins capsule Take 1 capsule by mouth daily.   escitalopram  (LEXAPRO ) 10 MG tablet Take 1 tablet (10 mg total) by mouth daily.   fluticasone  (FLONASE ) 50 MCG/ACT nasal spray Place 2 sprays into both nostrils 2 (two) times daily.   Glucosamine HCl (GLUCOSAMINE PO) Take by mouth in the morning and at bedtime.   hydrOXYzine  (VISTARIL ) 25 MG capsule Take 1 capsule (25 mg total) by mouth every 8 (eight) hours as needed.   ipratropium (ATROVENT ) 0.06 % nasal spray USE 2 SPRAYS IN EACH NOSTRIL THREE TIMES DAILY   losartan  (COZAAR ) 50 MG tablet Take 1 tablet (50 mg total) by mouth daily.   metFORMIN  (GLUCOPHAGE ) 500 MG tablet Take 2 tablets (1,000 mg total) by mouth 2 (two) times daily with a meal.   moxifloxacin  (AVELOX ) 400 MG tablet Take 1 tablet (400 mg total) by mouth daily.   Multiple Vitamins-Minerals (MULTIVITAMINS THER. W/MINERALS) TABS tablet Take 1 tablet by mouth daily.   Omega-3 Fatty Acids (FISH OIL PO) Take by mouth at bedtime.   PARoxetine  (PAXIL ) 40 MG tablet Take 1 tablet (40 mg total) by mouth daily.   primidone  (MYSOLINE ) 50 MG tablet Take 2 tablets (100 mg total) by mouth at bedtime.   sitaGLIPtin  (JANUVIA ) 100 MG tablet Take 1 tablet (100 mg total) by mouth daily.   tamsulosin  (FLOMAX ) 0.4 MG CAPS capsule Take 1 capsule (0.4 mg total) by mouth in the morning and at bedtime.   No facility-administered encounter medications on file as  of 08/06/2023.    Allergies (verified) Patient has no known allergies.   History: Past Medical History:  Diagnosis Date   Anxiety    Diabetes mellitus without complication (HCC)    Hearing loss    Sleep apnea    Past Surgical History:  Procedure Laterality Date   HERNIA REPAIR     TIBIA FRACTURE SURGERY Right    History reviewed. No pertinent family history. Social History   Socioeconomic History   Marital status: Married    Spouse name: LINDA   Number of  children: Not on file   Years of education: Not on file   Highest education level: Bachelor's degree (e.g., BA, AB, BS)  Occupational History   Not on file  Tobacco Use   Smoking status: Never   Smokeless tobacco: Never  Vaping Use   Vaping status: Never Used  Substance and Sexual Activity   Alcohol use: Not Currently   Drug use: Not Currently   Sexual activity: Not on file  Other Topics Concern   Not on file  Social History Narrative   Not on file   Social Drivers of Health   Financial Resource Strain: Low Risk  (08/06/2023)   Overall Financial Resource Strain (CARDIA)    Difficulty of Paying Living Expenses: Not hard at all  Food Insecurity: No Food Insecurity (08/06/2023)   Hunger Vital Sign    Worried About Running Out of Food in the Last Year: Never true    Ran Out of Food in the Last Year: Never true  Transportation Needs: No Transportation Needs (08/06/2023)   PRAPARE - Administrator, Civil Service (Medical): No    Lack of Transportation (Non-Medical): No  Physical Activity: Sufficiently Active (08/06/2023)   Exercise Vital Sign    Days of Exercise per Week: 7 days    Minutes of Exercise per Session: 60 min  Stress: No Stress Concern Present (08/06/2023)   [employer] of Occupational Health - Occupational Stress Questionnaire    Feeling of Stress : Not at all  Social Connections: Moderately Isolated (08/06/2023)   Social Connection and Isolation Panel [NHANES]    Frequency of Communication with Friends and Family: Never    Frequency of Social Gatherings with Friends and Family: Never    Attends Religious Services: Never    Database administrator or Organizations: Yes    Attends Engineer, structural: Never    Marital Status: Married    Tobacco Counseling Counseling given: Yes    Clinical Intake:  Pre-visit preparation completed: Yes  Pain : No/denies pain     BMI - recorded: 29.53 Nutritional Status: BMI 25 -29  Overweight Nutritional Risks: None Diabetes: Yes CBG done?: No  Lab Results  Component Value Date   HGBA1C 6.4 (H) 06/04/2023   HGBA1C 7.1 (H) 02/24/2023   HGBA1C 6.4 (H) 11/21/2022     How often do you need to have someone help you when you read instructions, pamphlets, or other written materials from your doctor or pharmacy?: 1 - Never  Interpreter Needed?: No  Information entered by :: Alia t/cma   Activities of Daily Living     08/06/2023    1:55 PM  In your present state of health, do you have any difficulty performing the following activities:  Hearing? 1  Comment pt use hearing aids  Vision? 1  Comment pt wear glasses/depends on the print size/pt goes to [place] in [place]/last eye exam 4/25  Difficulty concentrating or making decisions?  0  Walking or climbing stairs? 0  Dressing or bathing? 0  Doing errands, shopping? 0  Preparing Food and eating ? N  Using the Toilet? N  In the past six months, have you accidently leaked urine? N  Do you have problems with loss of bowel control? N  Managing your Medications? N  Managing your Finances? N  Housekeeping or managing your Housekeeping? N    Patient Care Team: Roise Cleaver, MD as PCP - General (Family Medicine)  I have updated your Care Teams any recent Medical Services you may have received from other providers in the past year.     Assessment:    This is a routine wellness examination for Brian Abbott.  Hearing/Vision screen Hearing Screening - Comments:: Pt use hearing aids Vision Screening - Comments:: pt wear glasses/depends on the print size/pt goes to [place] in [place]/last eye exam 4/25   Goals Addressed             This Visit's Progress    Patient Stated       Get all the bills under control       Depression Screen     08/06/2023    2:07 PM 06/04/2023    3:10 PM 02/24/2023    8:11 AM 11/21/2022    8:35 AM 08/20/2022   11:25 AM 04/22/2022    8:06 AM 03/19/2022   11:09 AM  PHQ 2/9 Scores   PHQ - 2 Score 1 0 0 0 0 0 0  PHQ- 9 Score  0         Fall Risk     08/06/2023    2:03 PM 02/24/2023    8:11 AM 11/21/2022    8:39 AM 11/21/2022    8:35 AM 09/23/2022   11:43 AM  Fall Risk   Falls in the past year? 0 0 0 0 0  Number falls in past yr: 0  0    Injury with Fall? 0  0    Risk for fall due to : No Fall Risks  History of fall(s)    Follow up Falls evaluation completed  Falls evaluation completed      MEDICARE RISK AT HOME:  Medicare Risk at Home Any stairs in or around the home?: Yes If so, are there any without handrails?: Yes Home free of loose throw rugs in walkways, pet beds, electrical cords, etc?: Yes Adequate lighting in your home to reduce risk of falls?: Yes Life alert?: No Use of a cane, walker or w/c?: No Grab bars in the bathroom?: Yes Shower chair or bench in shower?: Yes Elevated toilet seat or a handicapped toilet?: No  TIMED UP AND GO:  Was the test performed?  no  Cognitive Function: 6CIT completed        08/06/2023    2:08 PM 03/19/2022   11:10 AM  6CIT Screen  What Year? 0 points 0 points  What month? 0 points 0 points  What time? 3 points 0 points  Count back from 20 0 points 0 points  Months in reverse 0 points 0 points  Repeat phrase 0 points 4 points  Total Score 3 points 4 points    Immunizations Immunization History  Administered Date(s) Administered   Fluad Quad(high Dose 65+) 12/28/2021   Fluad Trivalent(High Dose 65+) 11/21/2022   Influenza, High Dose Seasonal PF 12/09/2018, 12/09/2018   Influenza-Unspecified 11/21/2022   PFIZER(Purple Top)SARS-COV-2 Vaccination 05/10/2019, 06/15/2019   PNEUMOCOCCAL CONJUGATE-20 02/24/2023   Pneumococcal Polysaccharide-23 09/28/2012  Tdap 11/21/2022   Zoster Recombinant(Shingrix ) 02/24/2023   Zoster, Live 09/28/2012    Screening Tests Health Maintenance  Topic Date Due   Zoster Vaccines- Shingrix  (2 of 2) 09/03/2023 (Originally 04/21/2023)   Hepatitis C Screening  11/21/2023  (Originally 02/19/1969)   COVID-19 Vaccine (3 - 2024-25 season) 08/21/2024 (Originally 11/03/2022)   INFLUENZA VACCINE  10/03/2023   HEMOGLOBIN A1C  12/04/2023   Diabetic kidney evaluation - Urine ACR  02/24/2024   Diabetic kidney evaluation - eGFR measurement  06/03/2024   FOOT EXAM  06/03/2024   OPHTHALMOLOGY EXAM  06/23/2024   Medicare Annual Wellness (AWV)  08/05/2024   Colonoscopy  12/07/2026   DTaP/Tdap/Td (2 - Td or Tdap) 11/20/2032   Pneumonia Vaccine 82+ Years old  Completed   HPV VACCINES  Aged Out   Meningococcal B Vaccine  Aged Out    Health Maintenance  There are no preventive care reminders to display for this patient.  Health Maintenance Items Addressed: See Nurse Notes at the end of this note  Additional Screening:  Vision Screening: Recommended annual ophthalmology exams for early detection of glaucoma and other disorders of the eye. Would you like a referral to an eye doctor? No    Dental Screening: Recommended annual dental exams for proper oral hygiene  Community Resource Referral / Chronic Care Management: CRR required this visit?  No   CCM required this visit?  No   Plan:    I have personally reviewed and noted the following in the patient's chart:   Medical and social history Use of alcohol, tobacco or illicit drugs  Current medications and supplements including opioid prescriptions. Patient is not currently taking opioid prescriptions. Functional ability and status Nutritional status Physical activity Advanced directives List of other physicians Hospitalizations, surgeries, and ER visits in previous 12 months Vitals Screenings to include cognitive, depression, and falls Referrals and appointments  In addition, I have reviewed and discussed with patient certain preventive protocols, quality metrics, and best practice recommendations. A written personalized care plan for preventive services as well as general preventive health recommendations  were provided to patient.   Michaelle Adolphus, CMA   08/06/2023   After Visit Summary: (MyChart) Due to this being a telephonic visit, the after visit summary with patients personalized plan was offered to patient via MyChart   Notes: Please remember to get your 2nd shingles vaccine at your next office visit.

## 2023-08-23 ENCOUNTER — Other Ambulatory Visit: Payer: Self-pay | Admitting: Family Medicine

## 2023-09-03 ENCOUNTER — Encounter: Payer: Self-pay | Admitting: Family Medicine

## 2023-09-03 ENCOUNTER — Ambulatory Visit: Admitting: Family Medicine

## 2023-09-03 VITALS — BP 116/68 | HR 67 | Temp 98.0°F | Ht 69.0 in | Wt 200.0 lb

## 2023-09-03 DIAGNOSIS — I10 Essential (primary) hypertension: Secondary | ICD-10-CM

## 2023-09-03 DIAGNOSIS — E1122 Type 2 diabetes mellitus with diabetic chronic kidney disease: Secondary | ICD-10-CM

## 2023-09-03 DIAGNOSIS — E782 Mixed hyperlipidemia: Secondary | ICD-10-CM | POA: Diagnosis not present

## 2023-09-03 DIAGNOSIS — Z7984 Long term (current) use of oral hypoglycemic drugs: Secondary | ICD-10-CM | POA: Diagnosis not present

## 2023-09-03 DIAGNOSIS — F324 Major depressive disorder, single episode, in partial remission: Secondary | ICD-10-CM

## 2023-09-03 DIAGNOSIS — N1832 Chronic kidney disease, stage 3b: Secondary | ICD-10-CM

## 2023-09-03 DIAGNOSIS — E1169 Type 2 diabetes mellitus with other specified complication: Secondary | ICD-10-CM | POA: Diagnosis not present

## 2023-09-03 DIAGNOSIS — N400 Enlarged prostate without lower urinary tract symptoms: Secondary | ICD-10-CM

## 2023-09-03 LAB — BAYER DCA HB A1C WAIVED: HB A1C (BAYER DCA - WAIVED): 6 % — ABNORMAL HIGH (ref 4.8–5.6)

## 2023-09-03 LAB — LIPID PANEL

## 2023-09-03 MED ORDER — LOSARTAN POTASSIUM 50 MG PO TABS: 50.0000 mg | ORAL_TABLET | Freq: Every day | ORAL | 3 refills | Status: AC

## 2023-09-03 MED ORDER — PAROXETINE HCL 40 MG PO TABS
40.0000 mg | ORAL_TABLET | Freq: Every day | ORAL | 3 refills | Status: DC
Start: 2023-09-03 — End: 2023-12-10

## 2023-09-03 MED ORDER — ACETIC ACID 2 % OT SOLN: 4.0000 [drp] | OTIC | 5 refills | Status: AC

## 2023-09-03 MED ORDER — METFORMIN HCL 500 MG PO TABS: 1000.0000 mg | ORAL_TABLET | Freq: Two times a day (BID) | ORAL | 3 refills | Status: AC

## 2023-09-03 MED ORDER — PRIMIDONE 50 MG PO TABS: 100.0000 mg | ORAL_TABLET | Freq: Every day | ORAL | 3 refills | Status: AC

## 2023-09-03 MED ORDER — TAMSULOSIN HCL 0.4 MG PO CAPS: 0.4000 mg | ORAL_CAPSULE | Freq: Two times a day (BID) | ORAL | 3 refills | Status: AC

## 2023-09-03 NOTE — Progress Notes (Signed)
 Subjective:  Patient ID: Brian Abbott, male    DOB: October 02, 1950  Age: 73 y.o. MRN: [id number]  CC: Medical Management of Chronic Issues (Would like PSA level drawn.), Numbness (Right hand and arm after waking up. Might be sleeping wrong but it has been every morning for 3 month. ), and referral (Would like a referral or recommendation for eye doctor. )   HPI Brian Abbott presents for presents forFollow-up of diabetes. Patient checks blood sugar at home.   80-140 fasting  Patient denies symptoms such as polyuria, polydipsia, excessive hunger, nausea No significant hypoglycemic spells noted. Medications reviewed. Pt reports taking them regularly without complication/adverse reaction being reported today.  Lab Results  Component Value Date   HGBA1C 6.0 (H) 09/03/2023   HGBA1C 6.4 (H) 06/04/2023   HGBA1C 7.1 (H) 02/24/2023         08/06/2023    2:07 PM 06/04/2023    3:10 PM 02/24/2023    8:11 AM  Depression screen PHQ 2/9  Decreased Interest 1 0 0  Down, Depressed, Hopeless 0 0 0  PHQ - 2 Score 1 0 0  Altered sleeping  0   Tired, decreased energy  0   Change in appetite  0   Feeling bad or failure about yourself   0   Trouble concentrating  0   Moving slowly or fidgety/restless  0   Suicidal thoughts  0   PHQ-9 Score  0   Difficult doing work/chores  Not difficult at all     History Brian Abbott has a past medical history of Anxiety, Diabetes mellitus without complication (HCC), Hearing loss, and Sleep apnea.   Brian Abbott has a past surgical history that includes Tibia fracture surgery (Right) and Hernia repair.   His family history is not on file.Brian Abbott reports that Brian Abbott has never smoked. Brian Abbott has never used smokeless tobacco. Brian Abbott reports that Brian Abbott does not currently use alcohol. Brian Abbott reports that Brian Abbott does not currently use drugs.    ROS Review of Systems  Constitutional:  Negative for fever.  Respiratory:  Negative for shortness of breath.   Cardiovascular:  Negative for chest pain.   Musculoskeletal:  Negative for arthralgias.  Skin:  Negative for rash.    Objective:  BP 116/68   Pulse 67   Temp 98 F (36.7 C)   Ht 5' 9 (1.753 m)   Wt 200 lb (90.7 kg)   SpO2 96%   BMI 29.53 kg/m   BP Readings from Last 3 Encounters:  09/03/23 116/68  08/06/23 (!) 159/82  07/10/23 (!) 159/82    Wt Readings from Last 3 Encounters:  09/03/23 200 lb (90.7 kg)  08/06/23 200 lb (90.7 kg)  07/10/23 200 lb (90.7 kg)     Physical Exam Vitals reviewed.  Constitutional:      Appearance: Brian Abbott is well-developed.  HENT:     Head: Normocephalic and atraumatic.     Right Ear: External ear normal.     Left Ear: External ear normal.     Mouth/Throat:     Pharynx: No oropharyngeal exudate or posterior oropharyngeal erythema.  Eyes:     Pupils: Pupils are equal, round, and reactive to light.  Cardiovascular:     Rate and Rhythm: Normal rate and regular rhythm.     Heart sounds: No murmur heard. Pulmonary:     Effort: No respiratory distress.     Breath sounds: Normal breath sounds.  Musculoskeletal:     Cervical back: Normal range of motion  and neck supple.  Neurological:     Mental Status: Brian Abbott is alert and oriented to person, place, and time.      Assessment & Plan:  Combined hyperlipidemia associated with type 2 diabetes mellitus (HCC) -     Bayer DCA Hb A1c Waived -     CBC with Differential/Platelet -     CMP14+EGFR -     Lipid panel -     Specimen status report  Type 2 diabetes mellitus with stage 3b chronic kidney disease, without long-term current use of insulin (HCC) -     CBC with Differential/Platelet -     CMP14+EGFR -     Losartan  Potassium; Take 1 tablet (50 mg total) by mouth daily.  Dispense: 90 tablet; Refill: 3 -     metFORMIN  HCl; Take 2 tablets (1,000 mg total) by mouth 2 (two) times daily with a meal.  Dispense: 360 tablet; Refill: 3 -     Specimen status report  Stage 3b chronic kidney disease (HCC) -     CBC with Differential/Platelet -      CMP14+EGFR -     Losartan  Potassium; Take 1 tablet (50 mg total) by mouth daily.  Dispense: 90 tablet; Refill: 3 -     Specimen status report  Essential hypertension -     CBC with Differential/Platelet -     CMP14+EGFR -     Losartan  Potassium; Take 1 tablet (50 mg total) by mouth daily.  Dispense: 90 tablet; Refill: 3 -     Specimen status report  Prostate enlargement -     CBC with Differential/Platelet -     CMP14+EGFR -     Tamsulosin  HCl; Take 1 capsule (0.4 mg total) by mouth in the morning and at bedtime.  Dispense: 180 capsule; Refill: 3 -     PSA -     Specimen status report  Major depressive disorder with single episode, in partial remission (HCC) -     PARoxetine  HCl; Take 1 tablet (40 mg total) by mouth daily.  Dispense: 90 tablet; Refill: 3 -     Specimen status report  Other orders -     Acetic Acid ; Place 4 drops into both ears every 3 (three) hours.  Dispense: 15 mL; Refill: 5 -     Primidone ; Take 2 tablets (100 mg total) by mouth at bedtime.  Dispense: 180 tablet; Refill: 3     Follow-up: Return in about 3 months (around 12/04/2023).  Butler Der, M.D.

## 2023-09-04 LAB — CMP14+EGFR
ALT: 18 IU/L (ref 0–44)
AST: 21 IU/L (ref 0–40)
Albumin: 4.5 g/dL (ref 3.8–4.8)
Alkaline Phosphatase: 83 IU/L (ref 44–121)
BUN/Creatinine Ratio: 12 (ref 10–24)
BUN: 17 mg/dL (ref 8–27)
Bilirubin Total: 0.3 mg/dL (ref 0.0–1.2)
CO2: 19 mmol/L — ABNORMAL LOW (ref 20–29)
Calcium: 9.8 mg/dL (ref 8.6–10.2)
Chloride: 103 mmol/L (ref 96–106)
Creatinine, Ser: 1.4 mg/dL — ABNORMAL HIGH (ref 0.76–1.27)
Globulin, Total: 2.2 g/dL (ref 1.5–4.5)
Glucose: 109 mg/dL — ABNORMAL HIGH (ref 70–99)
Potassium: 4.7 mmol/L (ref 3.5–5.2)
Sodium: 138 mmol/L (ref 134–144)
Total Protein: 6.7 g/dL (ref 6.0–8.5)
eGFR: 53 mL/min/{1.73_m2} — ABNORMAL LOW (ref >59)

## 2023-09-04 LAB — LIPID PANEL
Cholesterol, Total: 134 mg/dL (ref 100–199)
HDL: 38 mg/dL — AB (ref >39)
LDL CALC COMMENT:: 3.5 ratio (ref 0.0–5.0)
LDL Chol Calc (NIH): 82 mg/dL (ref 0–99)
Triglycerides: 69 mg/dL (ref 0–149)
VLDL Cholesterol Cal: 14 mg/dL (ref 5–40)

## 2023-09-04 LAB — CBC WITH DIFFERENTIAL/PLATELET
Basophils Absolute: 0.1 10*3/uL (ref 0.0–0.2)
Basos: 1 %
EOS (ABSOLUTE): 0.1 10*3/uL (ref 0.0–0.4)
Eos: 1 %
Hematocrit: 41.6 % (ref 37.5–51.0)
Hemoglobin: 13.5 g/dL (ref 13.0–17.7)
Immature Grans (Abs): 0 10*3/uL (ref 0.0–0.1)
Immature Granulocytes: 0 %
Lymphocytes Absolute: 2.2 10*3/uL (ref 0.7–3.1)
Lymphs: 31 %
MCH: 30.5 pg (ref 26.6–33.0)
MCHC: 32.5 g/dL (ref 31.5–35.7)
MCV: 94 fL (ref 79–97)
Monocytes Absolute: 0.5 10*3/uL (ref 0.1–0.9)
Monocytes: 7 %
Neutrophils Absolute: 4.4 10*3/uL (ref 1.4–7.0)
Neutrophils: 60 %
Platelets: 227 10*3/uL (ref 150–450)
RBC: 4.42 x10E6/uL (ref 4.14–5.80)
RDW: 12.3 % (ref 11.6–15.4)
WBC: 7.3 10*3/uL (ref 3.4–10.8)

## 2023-09-05 LAB — SPECIMEN STATUS REPORT

## 2023-09-05 LAB — PSA: Prostate Specific Ag, Serum: 4 ng/mL (ref 0.0–4.0)

## 2023-09-09 ENCOUNTER — Encounter: Payer: Self-pay | Admitting: Family Medicine

## 2023-09-11 ENCOUNTER — Ambulatory Visit: Payer: Self-pay | Admitting: Family Medicine

## 2023-09-11 NOTE — Progress Notes (Signed)
Hello Brian Abbott,  Your lab result is normal and/or stable.Some minor variations that are not significant are commonly marked abnormal, but do not represent any medical problem for you.  Best regards, Virna Livengood, M.D.

## 2023-09-18 ENCOUNTER — Encounter

## 2023-09-18 NOTE — Progress Notes (Unsigned)
 This encounter was created in error - please disregard.

## 2023-09-27 ENCOUNTER — Other Ambulatory Visit: Payer: Self-pay | Admitting: Family Medicine

## 2023-10-20 ENCOUNTER — Encounter: Admitting: Family Medicine

## 2023-10-25 DIAGNOSIS — R6884 Jaw pain: Secondary | ICD-10-CM | POA: Diagnosis not present

## 2023-10-25 DIAGNOSIS — K08409 Partial loss of teeth, unspecified cause, unspecified class: Secondary | ICD-10-CM | POA: Diagnosis not present

## 2023-11-21 ENCOUNTER — Other Ambulatory Visit: Payer: Self-pay | Admitting: Family Medicine

## 2023-12-10 ENCOUNTER — Ambulatory Visit: Admitting: Family Medicine

## 2023-12-10 ENCOUNTER — Encounter: Payer: Self-pay | Admitting: Family Medicine

## 2023-12-10 DIAGNOSIS — E785 Hyperlipidemia, unspecified: Secondary | ICD-10-CM | POA: Diagnosis not present

## 2023-12-10 DIAGNOSIS — E1122 Type 2 diabetes mellitus with diabetic chronic kidney disease: Secondary | ICD-10-CM | POA: Diagnosis not present

## 2023-12-10 DIAGNOSIS — I10 Essential (primary) hypertension: Secondary | ICD-10-CM | POA: Diagnosis not present

## 2023-12-10 DIAGNOSIS — Z7984 Long term (current) use of oral hypoglycemic drugs: Secondary | ICD-10-CM

## 2023-12-10 DIAGNOSIS — N1832 Chronic kidney disease, stage 3b: Secondary | ICD-10-CM | POA: Diagnosis not present

## 2023-12-10 LAB — LIPID PANEL

## 2023-12-10 LAB — BAYER DCA HB A1C WAIVED: HB A1C (BAYER DCA - WAIVED): 5.5 % (ref 4.8–5.6)

## 2023-12-10 NOTE — Progress Notes (Signed)
 Subjective:  Patient ID: Brian Abbott,  male    DOB: 01-14-1951  Age: 73 y.o.    CC: Medical Management of Chronic Issues   HPI ROGELIO WAYNICK presents for  follow-up of hypertension. Patient has no history of headache chest pain or shortness of breath or recent cough. Patient also denies symptoms of TIA such as numbness weakness lateralizing. Patient denies side effects from medication. States taking it regularly.  Patient also  in for follow-up of elevated cholesterol. Doing well without complaints on current medication. Denies side effects  including myalgia and arthralgia and nausea. Also in today for liver function testing. Currently no chest pain, shortness of breath or other cardiovascular related symptoms noted.  Follow-up of diabetes. Patient does check blood sugar at home. Readings rare occasional. Not abnormal. Patient denies symptoms such as excessive hunger or urinary frequency, excessive hunger, nausea No significant hypoglycemic spells noted. Medications reviewed. Pt reports taking them regularly. Pt. denies complication/adverse reaction today.   Discussed the use of AI scribe software for clinical note transcription with the patient, who gave verbal consent to proceed.  History of Present Illness         12/10/2023    8:18 AM 06/04/2023    3:09 PM 04/22/2022    8:07 AM 02/15/2022   11:20 AM  GAD 7 : Generalized Anxiety Score  Nervous, Anxious, on Edge 0 2 1 1   Control/stop worrying 0 0 0 0  Worry too much - different things 0 2 0 0  Trouble relaxing 0 0 0 1  Restless 0 0 0 0  Easily annoyed or irritable 0 3 1 1   Afraid - awful might happen 0 0 0 0  Total GAD 7 Score 0 7 2 3   Anxiety Difficulty Not difficult at all Somewhat difficult Not difficult at all Somewhat difficult        12/10/2023    8:19 AM 08/06/2023    2:07 PM 06/04/2023    3:10 PM 02/24/2023    8:11 AM 11/21/2022    8:35 AM  Depression screen PHQ 2/9  Decreased Interest 0 1 0 0 0   Down, Depressed, Hopeless 0 0 0 0 0  PHQ - 2 Score 0 1 0 0 0  Altered sleeping   0    Tired, decreased energy   0    Change in appetite   0    Feeling bad or failure about yourself    0    Trouble concentrating   0    Moving slowly or fidgety/restless   0    Suicidal thoughts   0    PHQ-9 Score   0    Difficult doing work/chores   Not difficult at all      History Trentan has a past medical history of Anxiety, Diabetes mellitus without complication (HCC), Hearing loss, and Sleep apnea.   He has a past surgical history that includes Tibia fracture surgery (Right) and Hernia repair.   His family history includes Cancer in his mother.He reports that he has never smoked. He has never used smokeless tobacco. He reports that he does not currently use alcohol. He reports that he does not currently use drugs.  Current Outpatient Medications on File Prior to Visit  Medication Sig Dispense Refill   acetic acid  2 % otic solution Place 4 drops into both ears every 3 (three) hours. 15 mL 5   aspirin EC 81 MG tablet Take 81 mg by mouth daily. Swallow  whole.     b complex vitamins capsule Take 1 capsule by mouth daily.     escitalopram  (LEXAPRO ) 10 MG tablet TAKE ONE (1) TABLET BY MOUTH EVERY DAY 90 tablet 0   fluticasone  (FLONASE ) 50 MCG/ACT nasal spray Place 2 sprays into both nostrils 2 (two) times daily. 16 g 9   Glucosamine HCl (GLUCOSAMINE PO) Take by mouth in the morning and at bedtime.     hydrOXYzine  (VISTARIL ) 25 MG capsule Take 1 capsule (25 mg total) by mouth every 8 (eight) hours as needed. 50 capsule 2   ipratropium (ATROVENT ) 0.06 % nasal spray USE 2 SPRAYS IN EACH NOSTRIL THREE TIMES DAILY 15 mL 2   losartan  (COZAAR ) 50 MG tablet Take 1 tablet (50 mg total) by mouth daily. 90 tablet 3   metFORMIN  (GLUCOPHAGE ) 500 MG tablet Take 2 tablets (1,000 mg total) by mouth 2 (two) times daily with a meal. 360 tablet 3   Multiple Vitamins-Minerals (MULTIVITAMINS THER. W/MINERALS) TABS tablet  Take 1 tablet by mouth daily.     Omega-3 Fatty Acids (FISH OIL PO) Take by mouth at bedtime.     primidone  (MYSOLINE ) 50 MG tablet Take 2 tablets (100 mg total) by mouth at bedtime. 180 tablet 3   sitaGLIPtin  (JANUVIA ) 100 MG tablet Take 1 tablet (100 mg total) by mouth daily. 90 tablet 3   tamsulosin  (FLOMAX ) 0.4 MG CAPS capsule Take 1 capsule (0.4 mg total) by mouth in the morning and at bedtime. 180 capsule 3   No current facility-administered medications on file prior to visit.    ROS Review of Systems  Constitutional: Negative.   HENT: Negative.    Eyes:  Negative for visual disturbance.  Respiratory:  Negative for cough and shortness of breath.   Cardiovascular:  Negative for chest pain and leg swelling.  Gastrointestinal:  Negative for abdominal pain, diarrhea, nausea and vomiting.  Genitourinary:  Negative for difficulty urinating.  Musculoskeletal:  Negative for arthralgias and myalgias.  Skin:  Negative for rash.  Neurological:  Negative for headaches.  Psychiatric/Behavioral:  Negative for sleep disturbance.     Objective:  BP 111/67   Pulse 65   Temp 98.3 F (36.8 C)   Ht 5' 9 (1.753 m)   Wt 197 lb (89.4 kg)   SpO2 96%   BMI 29.09 kg/m   BP Readings from Last 3 Encounters:  12/10/23 111/67  09/03/23 116/68  08/06/23 (!) 159/82    Wt Readings from Last 3 Encounters:  12/10/23 197 lb (89.4 kg)  09/03/23 200 lb (90.7 kg)  08/06/23 200 lb (90.7 kg)    Lab Results  Component Value Date   HGBA1C 5.5 12/10/2023   HGBA1C 6.0 (H) 09/03/2023   HGBA1C 6.4 (H) 06/04/2023    Physical Exam Vitals reviewed.  Constitutional:      Appearance: He is well-developed.  HENT:     Head: Normocephalic and atraumatic.     Right Ear: External ear normal.     Left Ear: External ear normal.     Mouth/Throat:     Pharynx: No oropharyngeal exudate or posterior oropharyngeal erythema.  Eyes:     Pupils: Pupils are equal, round, and reactive to light.  Cardiovascular:      Rate and Rhythm: Normal rate and regular rhythm.     Heart sounds: No murmur heard. Pulmonary:     Effort: No respiratory distress.     Breath sounds: Normal breath sounds.  Musculoskeletal:     Cervical back: Normal  range of motion and neck supple.  Neurological:     Mental Status: He is alert and oriented to person, place, and time.         Assessment & Plan:  Type 2 diabetes mellitus with stage 3b chronic kidney disease, without long-term current use of insulin (HCC) -     Bayer DCA Hb A1c Waived -     CMP14+EGFR  Essential hypertension -     CBC with Differential/Platelet  Dyslipidemia -     Lipid panel  Assessment and Plan Assessment & Plan      Follow-up: Return in about 4 months (around 04/11/2024) for diabetes.  Butler Der, M.D.

## 2023-12-11 LAB — CBC WITH DIFFERENTIAL/PLATELET
Basophils Absolute: 0 x10E3/uL (ref 0.0–0.2)
Basos: 0 %
EOS (ABSOLUTE): 0.1 x10E3/uL (ref 0.0–0.4)
Eos: 1 %
Hematocrit: 39 % (ref 37.5–51.0)
Hemoglobin: 12.6 g/dL — ABNORMAL LOW (ref 13.0–17.7)
Immature Grans (Abs): 0 x10E3/uL (ref 0.0–0.1)
Immature Granulocytes: 0 %
Lymphocytes Absolute: 2 x10E3/uL (ref 0.7–3.1)
Lymphs: 27 %
MCH: 31 pg (ref 26.6–33.0)
MCHC: 32.3 g/dL (ref 31.5–35.7)
MCV: 96 fL (ref 79–97)
Monocytes Absolute: 0.5 x10E3/uL (ref 0.1–0.9)
Monocytes: 7 %
Neutrophils Absolute: 4.7 x10E3/uL (ref 1.4–7.0)
Neutrophils: 65 %
Platelets: 229 x10E3/uL (ref 150–450)
RBC: 4.07 x10E6/uL — ABNORMAL LOW (ref 4.14–5.80)
RDW: 12.8 % (ref 11.6–15.4)
WBC: 7.3 x10E3/uL (ref 3.4–10.8)

## 2023-12-11 LAB — LIPID PANEL
Cholesterol, Total: 140 mg/dL (ref 100–199)
HDL: 40 mg/dL (ref >39)
LDL CALC COMMENT:: 3.5 ratio (ref 0.0–5.0)
LDL Chol Calc (NIH): 86 mg/dL (ref 0–99)
Triglycerides: 72 mg/dL (ref 0–149)
VLDL Cholesterol Cal: 14 mg/dL (ref 5–40)

## 2023-12-11 LAB — CMP14+EGFR
ALT: 17 IU/L (ref 0–44)
AST: 20 IU/L (ref 0–40)
Albumin: 4.3 g/dL (ref 3.8–4.8)
Alkaline Phosphatase: 76 IU/L (ref 47–123)
BUN/Creatinine Ratio: 13 (ref 10–24)
BUN: 18 mg/dL (ref 8–27)
Bilirubin Total: 0.5 mg/dL (ref 0.0–1.2)
CO2: 21 mmol/L (ref 20–29)
Calcium: 9.8 mg/dL (ref 8.6–10.2)
Chloride: 104 mmol/L (ref 96–106)
Creatinine, Ser: 1.44 mg/dL — ABNORMAL HIGH (ref 0.76–1.27)
Globulin, Total: 2.2 g/dL (ref 1.5–4.5)
Glucose: 113 mg/dL — ABNORMAL HIGH (ref 70–99)
Potassium: 4.3 mmol/L (ref 3.5–5.2)
Sodium: 141 mmol/L (ref 134–144)
Total Protein: 6.5 g/dL (ref 6.0–8.5)
eGFR: 52 mL/min/1.73 — ABNORMAL LOW (ref >59)

## 2023-12-15 NOTE — Progress Notes (Signed)
 Brian Abbott                                          MRN: [id number]   12/15/2023   The [facility] Quality Team Specialist reviewed this patient medical record for the purposes of chart review for care gap closure. The following were reviewed: abstraction for care gap closure-glycemic status assessment.    [facility] Quality Team

## 2023-12-16 ENCOUNTER — Other Ambulatory Visit: Payer: Self-pay | Admitting: Family Medicine

## 2024-01-09 ENCOUNTER — Other Ambulatory Visit: Payer: Self-pay | Admitting: Family Medicine

## 2024-01-30 LAB — MICROALBUMIN / CREATININE URINE RATIO: Microalb Creat Ratio: <30

## 2024-02-11 ENCOUNTER — Telehealth: Payer: Self-pay

## 2024-02-11 NOTE — Telephone Encounter (Signed)
 correct

## 2024-02-11 NOTE — Telephone Encounter (Signed)
 Copied from CRM #[id number]. Topic: Clinical - Medication Question >> Feb 11, 2024 12:27 PM Donna BRAVO wrote: Reason for CRM:  patient would like to know if he should take both of these medications together  escitalopram  (LEXAPRO ) 10 MG tablet  PARoxetine  (PAXIL ) 40 MG tablet  Patient would like someone to call him back regarding medication [phone]  Patient was informed they will receive a call back by end of business day

## 2024-02-12 NOTE — Telephone Encounter (Signed)
Pt notified.    LS

## 2024-02-20 ENCOUNTER — Other Ambulatory Visit: Payer: Self-pay | Admitting: Family Medicine

## 2024-03-01 ENCOUNTER — Ambulatory Visit: Payer: Self-pay

## 2024-03-01 NOTE — Telephone Encounter (Signed)
 FYI Only or Action Required?: FYI only for provider: appointment scheduled on 03/02/2024 at 9:20am with the Doctor of the Day at PCP office.  Patient was last seen in primary care on 12/10/2023 by Zollie Lowers, MD.  Called Nurse Triage reporting Cough.  Symptoms began 2 days ago.  Interventions attempted: OTC medications: [facility] brand cough suppressant, Prescription medications: benzonatate for cough, and Rest, hydration, or home remedies.  Symptoms are: gradually worsening.  Triage Disposition: See Physician Within 24 Hours  Patient/caregiver understands and will follow disposition?: Yes         Copied from CRM #[id number]. Topic: Clinical - Red Word Triage >> Mar 01, 2024  7:54 AM Larissa RAMAN wrote: [facility] that prompted transfer to Nurse Triage: chest pain, headaches, difficulty walking, fatigue Reason for Disposition  [1] Continuous (nonstop) coughing interferes with work or school AND [2] no improvement using cough treatment per Care Advice  Answer Assessment - Initial Assessment Questions Patient states he feels like he has the flu and states he had the flu shot  in October His family has been sick prior to him and unknown if they had the flu Body aches, soreness, a lot of mucous --started two days ago Patient denies chest pain but does have pain in his midsection only when he coughs White mucous Patient denies vomiting and unknown fever [facility] brand cough suppressant & benzonatate for cough.  Patient wanted to be seen today if possible. No openings today at PCP office, offered appointments today at surrounding PCP offices but patient didn't want to drive those distances. He is advised Urgent Care as an option but he decided to make an appointment with his PCP office tomorrow. He is advised that if anything changes to call us  back and if anything worsens to go to the Emergency Room. Patient verbalized understanding.  Protocols used: Cough - Acute Productive-A-AH

## 2024-03-01 NOTE — Telephone Encounter (Signed)
"  Appt made   "

## 2024-03-02 ENCOUNTER — Ambulatory Visit

## 2024-03-15 ENCOUNTER — Other Ambulatory Visit: Payer: Self-pay | Admitting: Family Medicine

## 2024-03-26 ENCOUNTER — Other Ambulatory Visit: Payer: Self-pay | Admitting: Family Medicine

## 2024-04-01 ENCOUNTER — Other Ambulatory Visit: Payer: Self-pay

## 2024-04-01 MED ORDER — FLUTICASONE PROPIONATE 50 MCG/ACT NA SUSP: 2.0000 | Freq: Two times a day (BID) | NASAL | 9 refills | Status: AC

## 2024-04-01 MED ORDER — IPRATROPIUM BROMIDE 0.06 % NA SOLN: 2.0000 | Freq: Three times a day (TID) | NASAL | 3 refills | Status: AC

## 2024-04-01 NOTE — Telephone Encounter (Signed)
 Copied from CRM [id number]. Topic: Clinical - Medication Refill >> Apr 01, 2024 11:14 AM Emylou G wrote: Medication: fluticasone  (FLONASE ) 50 MCG/ACT nasal spray ipratropium (ATROVENT ) 0.06 % nasal spray   Has the patient contacted their pharmacy? Yes (Agent: If no, request that the patient contact the pharmacy for the refill. If patient does not wish to contact the pharmacy document the reason why and proceed with request.) (Agent: If yes, when and what did the pharmacy advise?) said to call us   This is the patient's preferred pharmacy:  [facility], [place] - [address] [place] [address] Phone: [phone] Fax: [phone]  Is this the correct pharmacy for this prescription? Yes If no, delete pharmacy and type the correct one.   Has the prescription been filled recently? No  Is the patient out of the medication? Yes  Has the patient been seen for an appointment in the last year OR does the patient have an upcoming appointment? Yes  Can we respond through MyChart? No  Agent: Please be advised that Rx refills may take up to 3 business days. We ask that you follow-up with your pharmacy.

## 2024-04-03 ENCOUNTER — Other Ambulatory Visit: Payer: Self-pay | Admitting: Family Medicine

## 2024-04-12 ENCOUNTER — Ambulatory Visit: Payer: Self-pay | Admitting: Family Medicine
